# Patient Record
Sex: Male | Born: 1935
Health system: Southern US, Community
[De-identification: ages and names within clinical notes are randomized; demographics above are authoritative.]

## PROBLEM LIST (undated history)

## (undated) DIAGNOSIS — E785 Hyperlipidemia, unspecified: Secondary | ICD-10-CM

## (undated) DIAGNOSIS — M1990 Unspecified osteoarthritis, unspecified site: Secondary | ICD-10-CM

## (undated) DIAGNOSIS — I447 Left bundle-branch block, unspecified: Secondary | ICD-10-CM

## (undated) DIAGNOSIS — K635 Polyp of colon: Secondary | ICD-10-CM

## (undated) DIAGNOSIS — N200 Calculus of kidney: Secondary | ICD-10-CM

## (undated) DIAGNOSIS — I10 Essential (primary) hypertension: Secondary | ICD-10-CM

## (undated) DIAGNOSIS — S129XXA Fracture of neck, unspecified, initial encounter: Secondary | ICD-10-CM

## (undated) DIAGNOSIS — K2270 Barrett's esophagus without dysplasia: Secondary | ICD-10-CM

## (undated) DIAGNOSIS — D649 Anemia, unspecified: Secondary | ICD-10-CM

## (undated) HISTORY — DX: Left bundle-branch block, unspecified: I44.7

## (undated) HISTORY — DX: Unspecified osteoarthritis, unspecified site: M19.90

## (undated) HISTORY — PX: TONSILLECTOMY: SUR1361

## (undated) HISTORY — DX: Fracture of neck, unspecified, initial encounter: S12.9XXA

## (undated) HISTORY — DX: Barrett's esophagus without dysplasia: K22.70

## (undated) HISTORY — DX: Calculus of kidney: N20.0

## (undated) HISTORY — DX: Hyperlipidemia, unspecified: E78.5

## (undated) HISTORY — DX: Essential (primary) hypertension: I10

## (undated) HISTORY — DX: Anemia, unspecified: D64.9

## (undated) HISTORY — DX: Polyp of colon: K63.5

## (undated) HISTORY — PX: TOTAL HIP ARTHROPLASTY: SHX124

---

## 2004-03-28 ENCOUNTER — Ambulatory Visit: Payer: Self-pay | Admitting: Internal Medicine

## 2004-06-27 ENCOUNTER — Ambulatory Visit: Payer: Self-pay | Admitting: Internal Medicine

## 2004-07-11 ENCOUNTER — Ambulatory Visit: Payer: Self-pay | Admitting: Internal Medicine

## 2004-08-24 ENCOUNTER — Ambulatory Visit: Payer: Self-pay | Admitting: Internal Medicine

## 2005-01-25 ENCOUNTER — Ambulatory Visit: Payer: Self-pay | Admitting: Internal Medicine

## 2005-02-01 ENCOUNTER — Ambulatory Visit: Payer: Self-pay | Admitting: Internal Medicine

## 2005-02-09 ENCOUNTER — Encounter: Admission: RE | Admit: 2005-02-09 | Discharge: 2005-05-10 | Payer: Self-pay | Admitting: Internal Medicine

## 2005-05-18 ENCOUNTER — Ambulatory Visit: Payer: Self-pay | Admitting: Internal Medicine

## 2005-06-21 ENCOUNTER — Ambulatory Visit: Payer: Self-pay | Admitting: Internal Medicine

## 2005-12-21 ENCOUNTER — Ambulatory Visit: Payer: Self-pay | Admitting: Internal Medicine

## 2005-12-21 LAB — CONVERTED CEMR LAB
BUN: 14 mg/dL (ref 6–23)
CO2: 35 meq/L — ABNORMAL HIGH (ref 19–32)
Calcium: 9.9 mg/dL (ref 8.4–10.5)
Chol/HDL Ratio, serum: 3.2
Glomerular Filtration Rate, Af Am: 77 mL/min/{1.73_m2}
Glucose, Bld: 88 mg/dL (ref 70–99)
HDL: 63.2 mg/dL (ref >39.0)
Potassium: 3.9 meq/L (ref 3.5–5.1)
Sodium: 139 meq/L (ref 135–145)
Triglyceride fasting, serum: 76 mg/dL (ref 0–149)
VLDL: 15 mg/dL (ref 0–40)

## 2006-10-22 ENCOUNTER — Encounter (INDEPENDENT_AMBULATORY_CARE_PROVIDER_SITE_OTHER): Payer: Self-pay | Admitting: *Deleted

## 2006-11-13 ENCOUNTER — Ambulatory Visit: Payer: Self-pay | Admitting: Internal Medicine

## 2006-11-13 DIAGNOSIS — I10 Essential (primary) hypertension: Secondary | ICD-10-CM

## 2006-11-13 DIAGNOSIS — I447 Left bundle-branch block, unspecified: Secondary | ICD-10-CM | POA: Insufficient documentation

## 2006-11-13 DIAGNOSIS — E785 Hyperlipidemia, unspecified: Secondary | ICD-10-CM

## 2006-11-13 HISTORY — DX: Hyperlipidemia, unspecified: E78.5

## 2006-11-13 HISTORY — DX: Essential (primary) hypertension: I10

## 2006-11-23 ENCOUNTER — Ambulatory Visit: Payer: Self-pay | Admitting: Internal Medicine

## 2006-11-27 LAB — CONVERTED CEMR LAB
AST: 23 units/L (ref 0–37)
BUN: 14 mg/dL (ref 6–23)
Creatinine, Ser: 1 mg/dL (ref 0.4–1.5)
GFR calc non Af Amer: 78 mL/min
Glucose, Bld: 94 mg/dL (ref 70–99)
HDL: 58.6 mg/dL (ref >39.0)
Hemoglobin: 14.7 g/dL (ref 13.0–17.0)
LDL Cholesterol: 117 mg/dL — ABNORMAL HIGH (ref 0–99)
MCHC: 36 g/dL (ref 30.0–36.0)
MCV: 96.2 fL (ref 78.0–100.0)
Monocytes Relative: 11.6 % — ABNORMAL HIGH (ref 3.0–11.0)
Neutrophils Relative %: 62 % (ref 43.0–77.0)
PSA: 1.41 ng/mL (ref 0.10–4.00)
Platelets: 268 10*3/uL (ref 150–400)
Potassium: 3.9 meq/L (ref 3.5–5.1)
RBC: 4.24 M/uL (ref 4.22–5.81)
RDW: 11.9 % (ref 11.5–14.6)
Sodium: 142 meq/L (ref 135–145)
Total CHOL/HDL Ratio: 3.3
Triglycerides: 76 mg/dL (ref 0–149)

## 2006-11-30 ENCOUNTER — Ambulatory Visit: Payer: Self-pay | Admitting: Internal Medicine

## 2006-12-07 ENCOUNTER — Encounter: Payer: Self-pay | Admitting: Internal Medicine

## 2006-12-28 ENCOUNTER — Telehealth (INDEPENDENT_AMBULATORY_CARE_PROVIDER_SITE_OTHER): Payer: Self-pay | Admitting: *Deleted

## 2007-08-19 ENCOUNTER — Ambulatory Visit: Payer: Self-pay | Admitting: Gastroenterology

## 2007-09-02 ENCOUNTER — Encounter: Payer: Self-pay | Admitting: Gastroenterology

## 2007-09-02 ENCOUNTER — Ambulatory Visit: Payer: Self-pay | Admitting: Gastroenterology

## 2007-09-03 ENCOUNTER — Encounter: Payer: Self-pay | Admitting: Gastroenterology

## 2008-01-29 ENCOUNTER — Ambulatory Visit: Payer: Self-pay | Admitting: Internal Medicine

## 2008-01-29 DIAGNOSIS — M1990 Unspecified osteoarthritis, unspecified site: Secondary | ICD-10-CM

## 2008-02-04 ENCOUNTER — Ambulatory Visit: Payer: Self-pay | Admitting: Internal Medicine

## 2008-02-10 ENCOUNTER — Telehealth (INDEPENDENT_AMBULATORY_CARE_PROVIDER_SITE_OTHER): Payer: Self-pay | Admitting: *Deleted

## 2008-02-10 LAB — CONVERTED CEMR LAB
Cholesterol: 214 mg/dL (ref 0–200)
HDL: 71 mg/dL (ref >39.0)
Total CHOL/HDL Ratio: 3
Triglycerides: 94 mg/dL (ref 0–149)
VLDL: 19 mg/dL (ref 0–40)

## 2008-04-01 ENCOUNTER — Ambulatory Visit: Payer: Self-pay | Admitting: Internal Medicine

## 2008-07-30 ENCOUNTER — Ambulatory Visit: Payer: Self-pay | Admitting: Internal Medicine

## 2008-07-30 DIAGNOSIS — N400 Enlarged prostate without lower urinary tract symptoms: Secondary | ICD-10-CM

## 2008-07-30 HISTORY — DX: Benign prostatic hyperplasia without lower urinary tract symptoms: N40.0

## 2008-08-03 LAB — CONVERTED CEMR LAB: PSA: 2.29 ng/mL (ref 0.10–4.00)

## 2008-10-20 ENCOUNTER — Telehealth (INDEPENDENT_AMBULATORY_CARE_PROVIDER_SITE_OTHER): Payer: Self-pay | Admitting: *Deleted

## 2008-10-28 ENCOUNTER — Ambulatory Visit: Payer: Self-pay | Admitting: Internal Medicine

## 2008-10-31 ENCOUNTER — Encounter: Admission: RE | Admit: 2008-10-31 | Discharge: 2008-10-31 | Payer: Self-pay | Admitting: Internal Medicine

## 2008-11-06 ENCOUNTER — Encounter: Payer: Self-pay | Admitting: Internal Medicine

## 2009-02-08 ENCOUNTER — Ambulatory Visit: Payer: Self-pay | Admitting: Internal Medicine

## 2009-02-11 LAB — CONVERTED CEMR LAB
BUN: 13 mg/dL (ref 6–23)
Basophils Absolute: 0.1 10*3/uL (ref 0.0–0.1)
Basophils Relative: 0.7 % (ref 0.0–3.0)
Calcium: 10.2 mg/dL (ref 8.4–10.5)
Chloride: 98 meq/L (ref 96–112)
Cholesterol: 237 mg/dL — ABNORMAL HIGH (ref 0–200)
Eosinophils Absolute: 0.2 10*3/uL (ref 0.0–0.7)
Glucose, Bld: 98 mg/dL (ref 70–99)
Lymphocytes Relative: 18.4 % (ref 12.0–46.0)
MCHC: 34.1 g/dL (ref 30.0–36.0)
Monocytes Absolute: 0.9 10*3/uL (ref 0.1–1.0)
Monocytes Relative: 11 % (ref 3.0–12.0)
Neutrophils Relative %: 67.4 % (ref 43.0–77.0)
RBC: 4.22 M/uL (ref 4.22–5.81)
Total CHOL/HDL Ratio: 3
Triglycerides: 93 mg/dL (ref 0.0–149.0)

## 2009-02-16 ENCOUNTER — Encounter (INDEPENDENT_AMBULATORY_CARE_PROVIDER_SITE_OTHER): Payer: Self-pay | Admitting: *Deleted

## 2009-03-01 ENCOUNTER — Telehealth (INDEPENDENT_AMBULATORY_CARE_PROVIDER_SITE_OTHER): Payer: Self-pay | Admitting: *Deleted

## 2009-03-17 ENCOUNTER — Encounter: Payer: Self-pay | Admitting: Internal Medicine

## 2009-03-26 ENCOUNTER — Encounter: Payer: Self-pay | Admitting: Internal Medicine

## 2009-03-27 ENCOUNTER — Ambulatory Visit (HOSPITAL_COMMUNITY): Admission: RE | Admit: 2009-03-27 | Discharge: 2009-03-27 | Payer: Self-pay | Admitting: Neurology

## 2009-04-02 ENCOUNTER — Encounter: Payer: Self-pay | Admitting: Internal Medicine

## 2009-04-05 ENCOUNTER — Telehealth (INDEPENDENT_AMBULATORY_CARE_PROVIDER_SITE_OTHER): Payer: Self-pay | Admitting: *Deleted

## 2009-04-07 ENCOUNTER — Telehealth (INDEPENDENT_AMBULATORY_CARE_PROVIDER_SITE_OTHER): Payer: Self-pay | Admitting: *Deleted

## 2009-04-30 ENCOUNTER — Ambulatory Visit: Payer: Self-pay | Admitting: Internal Medicine

## 2009-05-03 LAB — CONVERTED CEMR LAB: Folate: >20 ng/mL

## 2009-05-24 ENCOUNTER — Encounter: Payer: Self-pay | Admitting: Internal Medicine

## 2009-05-25 ENCOUNTER — Encounter: Admission: RE | Admit: 2009-05-25 | Discharge: 2009-08-23 | Payer: Self-pay | Admitting: Orthopedic Surgery

## 2009-05-26 ENCOUNTER — Ambulatory Visit: Payer: Self-pay | Admitting: Internal Medicine

## 2009-05-26 LAB — CONVERTED CEMR LAB
Bilirubin Urine: NEGATIVE
Glucose, Urine, Semiquant: NEGATIVE
Nitrite: NEGATIVE
Urobilinogen, UA: 0.2
WBC Urine, dipstick: NEGATIVE

## 2009-05-27 ENCOUNTER — Encounter: Payer: Self-pay | Admitting: Internal Medicine

## 2009-05-28 ENCOUNTER — Ambulatory Visit: Payer: Self-pay | Admitting: Diagnostic Radiology

## 2009-05-28 ENCOUNTER — Emergency Department (HOSPITAL_BASED_OUTPATIENT_CLINIC_OR_DEPARTMENT_OTHER): Admission: EM | Admit: 2009-05-28 | Discharge: 2009-05-28 | Payer: Self-pay | Admitting: Emergency Medicine

## 2009-05-31 ENCOUNTER — Inpatient Hospital Stay (HOSPITAL_COMMUNITY): Admission: EM | Admit: 2009-05-31 | Discharge: 2009-06-05 | Payer: Self-pay | Admitting: Emergency Medicine

## 2009-05-31 ENCOUNTER — Telehealth: Payer: Self-pay | Admitting: Internal Medicine

## 2009-06-03 LAB — CONVERTED CEMR LAB

## 2009-06-07 ENCOUNTER — Encounter: Payer: Self-pay | Admitting: Internal Medicine

## 2009-06-16 ENCOUNTER — Ambulatory Visit: Payer: Self-pay | Admitting: Internal Medicine

## 2009-06-16 DIAGNOSIS — G4700 Insomnia, unspecified: Secondary | ICD-10-CM | POA: Insufficient documentation

## 2009-06-21 ENCOUNTER — Telehealth (INDEPENDENT_AMBULATORY_CARE_PROVIDER_SITE_OTHER): Payer: Self-pay | Admitting: *Deleted

## 2009-06-22 ENCOUNTER — Ambulatory Visit: Payer: Self-pay | Admitting: Internal Medicine

## 2009-06-22 ENCOUNTER — Ambulatory Visit: Payer: Self-pay

## 2009-06-23 ENCOUNTER — Encounter (INDEPENDENT_AMBULATORY_CARE_PROVIDER_SITE_OTHER): Payer: Self-pay | Admitting: *Deleted

## 2009-06-23 ENCOUNTER — Encounter: Payer: Self-pay | Admitting: Internal Medicine

## 2009-06-23 ENCOUNTER — Ambulatory Visit: Payer: Self-pay | Admitting: Internal Medicine

## 2009-06-25 ENCOUNTER — Ambulatory Visit: Payer: Self-pay | Admitting: Internal Medicine

## 2009-07-09 ENCOUNTER — Ambulatory Visit: Payer: Self-pay | Admitting: Internal Medicine

## 2009-07-12 LAB — CONVERTED CEMR LAB
BUN: 20 mg/dL (ref 6–23)
CO2: 29 meq/L (ref 19–32)
Chloride: 99 meq/L (ref 96–112)
Glucose, Bld: 88 mg/dL (ref 70–99)
Potassium: 5.2 meq/L (ref 3.5–5.3)
Sodium: 139 meq/L (ref 135–145)

## 2009-08-23 ENCOUNTER — Encounter: Admission: RE | Admit: 2009-08-23 | Discharge: 2009-09-29 | Payer: Self-pay | Admitting: Orthopedic Surgery

## 2009-08-25 ENCOUNTER — Ambulatory Visit: Payer: Self-pay | Admitting: Internal Medicine

## 2009-08-25 LAB — CONVERTED CEMR LAB: Hemoglobin: 15.1 g/dL

## 2009-09-27 ENCOUNTER — Telehealth: Payer: Self-pay | Admitting: Internal Medicine

## 2009-09-28 ENCOUNTER — Encounter: Payer: Self-pay | Admitting: Internal Medicine

## 2009-09-28 ENCOUNTER — Telehealth: Payer: Self-pay | Admitting: Internal Medicine

## 2009-10-04 ENCOUNTER — Encounter: Payer: Self-pay | Admitting: Internal Medicine

## 2009-10-07 HISTORY — PX: TOTAL HIP ARTHROPLASTY: SHX124

## 2009-10-22 ENCOUNTER — Encounter: Payer: Self-pay | Admitting: Internal Medicine

## 2009-11-08 ENCOUNTER — Encounter
Admission: RE | Admit: 2009-11-08 | Discharge: 2010-01-25 | Payer: Self-pay | Source: Home / Self Care | Attending: Orthopedic Surgery | Admitting: Orthopedic Surgery

## 2009-12-03 ENCOUNTER — Encounter: Payer: Self-pay | Admitting: Internal Medicine

## 2010-02-06 DIAGNOSIS — N200 Calculus of kidney: Secondary | ICD-10-CM

## 2010-02-06 HISTORY — DX: Calculus of kidney: N20.0

## 2010-02-17 ENCOUNTER — Ambulatory Visit
Admission: RE | Admit: 2010-02-17 | Discharge: 2010-02-17 | Payer: Self-pay | Source: Home / Self Care | Attending: Internal Medicine | Admitting: Internal Medicine

## 2010-02-17 ENCOUNTER — Other Ambulatory Visit: Payer: Self-pay | Admitting: Internal Medicine

## 2010-02-17 LAB — BASIC METABOLIC PANEL
BUN: 22 mg/dL (ref 6–23)
CO2: 27 mEq/L (ref 19–32)
Calcium: 9.2 mg/dL (ref 8.4–10.5)
Chloride: 99 mEq/L (ref 96–112)
Creatinine, Ser: 1 mg/dL (ref 0.4–1.5)
GFR: 78.37 mL/min (ref >60.00)
Glucose, Bld: 90 mg/dL (ref 70–99)
Potassium: 3.4 mEq/L — ABNORMAL LOW (ref 3.5–5.1)
Sodium: 140 mEq/L (ref 135–145)

## 2010-02-17 LAB — CBC WITH DIFFERENTIAL/PLATELET
Basophils Absolute: 0 10*3/uL (ref 0.0–0.1)
Basophils Relative: 0.4 % (ref 0.0–3.0)
Eosinophils Absolute: 0.2 10*3/uL (ref 0.0–0.7)
Eosinophils Relative: 3.2 % (ref 0.0–5.0)
HCT: 44.3 % (ref 39.0–52.0)
Hemoglobin: 15.5 g/dL (ref 13.0–17.0)
Lymphocytes Relative: 17.9 % (ref 12.0–46.0)
Lymphs Abs: 1.3 10*3/uL (ref 0.7–4.0)
MCHC: 34.9 g/dL (ref 30.0–36.0)
MCV: 99.2 fl (ref 78.0–100.0)
Monocytes Absolute: 0.9 10*3/uL (ref 0.1–1.0)
Monocytes Relative: 11.8 % (ref 3.0–12.0)
Neutro Abs: 4.8 10*3/uL (ref 1.4–7.7)
Neutrophils Relative %: 66.7 % (ref 43.0–77.0)
Platelets: 287 10*3/uL (ref 150.0–400.0)
RBC: 4.47 Mil/uL (ref 4.22–5.81)
RDW: 13.8 % (ref 11.5–14.6)
WBC: 7.2 10*3/uL (ref 4.5–10.5)

## 2010-02-17 LAB — LIPID PANEL
Cholesterol: 228 mg/dL — ABNORMAL HIGH (ref 0–200)
HDL: 70.3 mg/dL (ref >39.00)
Total CHOL/HDL Ratio: 3
Triglycerides: 79 mg/dL (ref 0.0–149.0)
VLDL: 15.8 mg/dL (ref 0.0–40.0)

## 2010-02-17 LAB — LDL CHOLESTEROL, DIRECT: Direct LDL: 142.8 mg/dL

## 2010-02-17 LAB — PSA: PSA: 2.01 ng/mL (ref 0.10–4.00)

## 2010-03-06 LAB — CONVERTED CEMR LAB: Hemoglobin: 12.6 g/dL

## 2010-03-10 NOTE — Assessment & Plan Note (Signed)
Summary: check vitals/swh  Nurse Visit   Vital Signs:  Patient profile:   77 year old male Height:      72 inches Weight:      168 pounds Pulse rate:   110 / minute BP sitting:   140 / 80  Vitals Entered By: Shary Decamp (Jun 25, 2009 3:30 PM) Comments PULSE @ HOME [facility] .......Marland KitchenShary Decamp  Jun 25, 2009 3:43 PM Discussed with Dr. Drue Novel.  Atenolol Chlorthalidone 50/25 1 daily was d/c'd in hospital.  Dr. Drue Novel would like for pt to restart @ 1/2 tablet daily.  Nurse visit in 2 weeks for bp, bmp. ........Marland KitchenShary Decamp  Jun 25, 2009 3:59 PM    Serial Vital Signs/Assessments:  Time      Position  BP       Pulse  Resp  Temp     By 3:43 PM             130/72   104                   Stacia Hawks   Current Medications (verified): 1)  Ferrous Sulfate 325 (65 Fe) Mg Tabs (Ferrous Sulfate) .... One By Mouth Twice A Day 2)  Tylenol Arthritis Pain 650 Mg Cr-Tabs (Acetaminophen) .... 2 Every 5 Hours 3)  Senekot .... 2 Daily 4)  Miralax .... 1/2 C Daily 5)  Mvi 6)  Zinc 7)  Atenolol-Chlorthalidone 50-25 Mg Tabs (Atenolol-Chlorthalidone) .Marland Kitchen.. 1 Daily  Allergies (verified): No Known Drug Allergies  Impression & Recommendations:  Problem # 1:  TACHYCARDIA (ICD-785.0) maybe related to the fact that he is not on BB ----> restart BB Orders: No Charge Patient Arrived (NCPA0) (NCPA0)  Problem # 2:  HYPERTENSION (ICD-401.9) re start meds at 1/2 dose  His updated medication list for this problem includes:    Atenolol-chlorthalidone 50-25 Mg Tabs (Atenolol-chlorthalidone) .Marland Kitchen... 1 daily  BP today: 140/80 Prior BP: 132/80 (06/22/2009)  Labs Reviewed: K+: 4.0 (02/08/2009) Creat: : 0.9 (02/08/2009)   Chol: 237 (02/08/2009)   HDL: 72.10 (02/08/2009)   LDL: DEL (02/04/2008)   TG: 93.0 (02/08/2009)  Complete Medication List: 1)  Ferrous Sulfate 325 (65 Fe) Mg Tabs (Ferrous sulfate) .... One by mouth twice a day 2)  Tylenol Arthritis Pain 650 Mg Cr-tabs (Acetaminophen) .... 2 every 5 hours 3)   Senekot  .... 2 daily 4)  Miralax  .... 1/2 c daily 5)  Mvi  6)  Zinc  7)  Atenolol-chlorthalidone 50-25 Mg Tabs (Atenolol-chlorthalidone) .Marland Kitchen.. 1 daily   Orders Added: 1)  No Charge Patient Arrived (NCPA0) [NCPA0] Prescriptions: ATENOLOL-CHLORTHALIDONE 50-25 MG TABS (ATENOLOL-CHLORTHALIDONE) 1 daily  #30 x 6   Entered by:   Shary Decamp   Authorized by:   Nolon Rod. Jonnae Fonseca MD   Signed by:   Shary Decamp on 06/25/2009   Method used:   Electronically to        [facility] [phone][facility] (retail)       [address]       [place], [place]  [address]       Ph: [phone]       Fax: [phone]   RxID:   [id number]

## 2010-03-10 NOTE — Assessment & Plan Note (Signed)
Summary: roa per pt//lch   Vital Signs:  Patient profile:   77 year old male Height:      72 inches Weight:      182.3 pounds BMI:     24.81 Pulse rate:   58 / minute BP sitting:   134 / 88  Vitals Entered By: R.Peeler CC: roa   History of Present Illness: routine office visit  Allergies: No Known Drug Allergies  Past History:  Past Medical History: Hypertension HYPERLIPIDEMIA, BORDERLINE  LBBB (ICD-426.3) Osteoarthritis, severe hip-back pain, saw  neurology, nerve conduction study 2/11 negative    Past Surgical History: Reviewed history from 11/13/2006 and no changes required. Tonsillectomy  Social History: Reviewed history from 01/29/2008 and no changes required. 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  They are thinking about doing a left hip replacement later on CV:  Denies chest pain or discomfort and swelling of feet. Resp:  Denies cough and shortness of breath. GI:  Denies bloody stools.  Physical Exam  General:  alert and well-developed.   Lungs:  normal respiratory effort, no intercostal retractions, no accessory muscle use, and normal breath sounds.   Heart:  normal rate, regular rhythm, and no murmur.   Msk:  still walks with difficulty due to pain Extremities:  no lower extremity edema   Impression & Recommendations:  Problem # 1:  ? of B12 DEFICIENCY (ICD-266.2)  last CBC showed elevated MCV, labs  Orders: Venipuncture (21308)  Problem # 2:  HYPERTENSION (ICD-401.9) stable His updated medication list for this problem includes:    Atenolol-chlorthalidone 50-25 Mg  Tabs (Atenolol-chlorthalidone) .Marland Kitchen... 1 by mouth once daily - due office visit 04/2009  BP today: 134/88 Prior BP: 138/84 (02/08/2009)  Labs Reviewed: K+: 4.0 (02/08/2009) Creat: : 0.9 (02/08/2009)   Chol: 237 (02/08/2009)   HDL: 72.10 (02/08/2009)   LDL: DEL (02/04/2008)   TG: 93.0 (02/08/2009)  Problem # 3:  OSTEOARTHRITIS (ICD-715.90) since the last office visit, he was seen by neurology due to back pain, it was determined that he had a  hip  problem, he saw an orthopedic surgeon at [facility], he is scheduled to have a right hip replacement in April.  They are thinking about doing a left hip replacement later on  Complete Medication List: 1)  Klor-con M20 20 Meq Tbcr (Potassium chloride crys cr) .Marland Kitchen.. 1 by mouth qd 2)  Atenolol-chlorthalidone 50-25 Mg Tabs (Atenolol-chlorthalidone) .Marland Kitchen.. 1 by mouth once daily - due office visit 04/2009  Patient Instructions: 1)  Please schedule a follow-up appointment in 6 months .

## 2010-03-10 NOTE — Letter (Signed)
Summary: CT Letter  [facility]  [address] [place], [place] [address]   Phone: [phone]  Fax: [phone]    [facility]  06/23/2009  Patient's Name: Chase Hernandez MRN: [id number]  The [place] Government (Patient Protection and [employer] Act, Sections [phone] and [phone], dated March 23. 2010) mandates that we provide information to Medicare/Medicaid patients on facilities other than those owned by [employer].   Section [phone] of the Act amends the in-office ancillary services exception under [place] laws by requiring a referring physician to inform patients in writing, at the time of a referral, that the patients may obtain specified imaging services such as a CT exam from an entity other than the designated imaging equipment requested from the referring physician.  The provision specifically requires the referring physician to provide the patient with a written list of suppliers who furnish such services in the area in which the patient resides.  [facility] imaging is pleased to provide you with the Medicare required information as it relates to CT services so that you an make an informed decision. [facility] takes pride in delivering high quality services where same day and next day exams are available. When your exam is performed at the [facility] CT scanner, the results are accessible to physicians and are retained in your electronic medical chart, where your physician can review the results at any time and compare them to any previous or future images you may have.  We hope you will continue to allow [place] the privilege of using our  services. Your scheduler has provided you a list of Imaging Centers within a radius of this office and your signature below indicates you have been given this information to make a wise choice.         Patient Signature:            Date:   06-23-2009 The above signature verifies that the patient was  given this letter and a choice in their imaging location.

## 2010-03-10 NOTE — Letter (Signed)
Summary: *Referral Letter  [facility]  [address] [place], [place] [address]   Phone: [phone]  Fax: [phone]    02/08/2009  Thank you in advance for agreeing to see my patient:  Macauley Mossberg [address] [place], [place]  [address]  Phone: [phone]  Reason for Referral:  Ayaansh developed severe hip pain less than a year ago; since then, he has been seen by the  orthopedic surgeon, a  MRI show changes consistent with OA.   He continued to be debilitated and has generalized lower extremity weakness. Please evaluate this nice gentleman  from the neurological standpoint and help me  rule-out other issues different from OA. Up to a year ago, Victorio  practiced  tae kwon do without any difficult   Current Medications: 1)  KLOR-CON M20 20 MEQ TBCR (POTASSIUM CHLORIDE CRYS CR) 1 by mouth qd 2)  ATENOLOL-CHLORTHALIDONE 50-25 MG TABS (ATENOLOL-CHLORTHALIDONE)    Past Medical History: 1)  Hypertension 2)  HYPERLIPIDEMIA, BORDERLINE  3)  LBBB (ICD-426.3) 4)  Osteoarthritis 5)  increased PSA      Pertinent Labs: will enclose   Thank you again for agreeing to see our patient; please contact us if you have any further questions or need additional information.  Sincerely,  Lemoyne Nestor E. Lovenia Debruler MD

## 2010-03-10 NOTE — Assessment & Plan Note (Signed)
Summary: CPX WITH FASTING LABS-SCM/N/S  Flu Vaccine Consent Questions     Do you have a history of severe allergic reactions to this vaccine? no    Any prior history of allergic reactions to egg and/or gelatin? no    Do you have a sensitivity to the preservative Thimersol? no    Do you have a past history of Guillan-Barre Syndrome? no    Do you currently have an acute febrile illness? no    Have you ever had a severe reaction to latex? no    Vaccine information given and explained to patient? yes    Are you currently pregnant? no    Lot Number:[id number]   Exp Date:08/05/2009   Site Given  right Deltoid IM Shary Decamp  February 08, 2009 8:09 AM   Vital Signs:  Patient profile:   77 year old male Height:      72.75 inches Weight:      178.6 pounds BMI:     23.81 Pulse rate:   70 / minute Pulse rhythm:   regular BP sitting:   138 / 84  (left arm) Cuff size:   large  Vitals Entered By: Shary Decamp (February 08, 2009 8:03 AM)  History of Present Illness: Hypertension-- good medication compliance, good ambulatory BPs   HYPERLIPIDEMIA,on diet, eats healthy   Osteoarthritis-- cont w/ hip pain , declines to take pain medication. Status-post physical therapy for two months. Still very weak and uses a walker  yearly--chart reviewed   Current Medications (verified): 1)  Klor-Con M20 20 Meq Tbcr (Potassium Chloride Crys Cr) .Marland Kitchen.. 1 By Mouth Qd 2)  Atenolol-Chlorthalidone 50-25 Mg Tabs (Atenolol-Chlorthalidone)  Allergies (verified): No Known Drug Allergies  Past History:  Past Medical History: Hypertension HYPERLIPIDEMIA, BORDERLINE  LBBB (ICD-426.3) Osteoarthritis increased PSA   Past Surgical History: Reviewed history from 11/13/2006 and no changes required. Tonsillectomy  Family History: Reviewed history from 01/29/2008 and no changes required. M: breast Ca GM: DM MI--no prostate ca--F? colon ca--no  Social History: Reviewed history from 01/29/2008 and no  changes required. 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 Resp:  Denies cough and shortness of breath. GI:  Denies bloody stools, diarrhea, nausea, and vomiting. GU:  Denies hematuria, urinary frequency, and urinary hesitancy. Neuro:  no b/b incontinence  no LE paresthesias (Some times at R foot?). Psych:  Denies anxiety and depression.  Physical Exam  General:  alert, well-developed, and well-nourished.   Lungs:  normal respiratory effort, no intercostal retractions, no accessory muscle use, and normal breath sounds.   Heart:  normal rate, regular rhythm, and no murmur.   Abdomen:  soft, non-tender, no distention, no masses, no guarding, and no rigidity.   Rectal:  No external abnormalities noted. Normal sphincter tone. No rectal masses or tenderness. Prostate:  Prostate gland firm and smooth, no enlargement, nodularity, tenderness, mass, asymmetry or induration. Msk:  generalized lower extremity weakness, slightly worse on the right side.  Walks very slow and with great difficulty using two canes Extremities:  no edema   Impression & Recommendations:  Problem # 1:  OSTEOARTHRITIS (ICD-715.90) the patient continued with severe motor  limitations status post orthopedic eval , the MRIs showed OA  only I remain concerned about the sudden onset of pain , LE weakness and motor deterioration  Up to a year ago, this gentleman practiced tae kwon do. His generalized lower extremity weakness concerns me. plan----Neurology referral The following medications were removed from the medication list:    Darvocet-n 100 100-650 Mg Tabs (Propoxyphene n-apap) .Marland Kitchen... 1 by mouth at bedtime as needed pain  Orders: T-Vitamin D (25-Hydroxy) [phone]) TLB-TSH (Thyroid Stimulating Hormone)  (84443-TSH) TLB-B12 + Folate Pnl (09811_91478-G95/AOZ) TLB-Sedimentation Rate (ESR) (85652-ESR) Neurology Referral (Neuro)  Problem # 2:  HEALTH SCREENING (ICD-V70.0) Td --2004 pneumonia shot--2007 got a  flu shot   apparently had a  shingles shot  DEXA 11-2006 normal Cscope 08-2007 had a tubular adenoma, next Cscope per chart is in 2014    Problem # 3:  HYPERLIPIDEMIA, BORDERLINE (ICD-272.4) diet only  Labs Reviewed: SGOT: 23 (11/23/2006)   SGPT: 27 (11/23/2006)   HDL:71.0 (02/04/2008), 58.6 (11/23/2006)  LDL:DEL (02/04/2008), 117 (11/23/2006)  Chol:214 (02/04/2008), 191 (11/23/2006)  Trig:94 (02/04/2008), 76 (11/23/2006)  Orders: TLB-Lipid Panel (80061-LIPID)  Problem # 4:  HYPERTENSION (ICD-401.9) at goal  His updated medication list for this problem includes:    Atenolol-chlorthalidone 50-25 Mg Tabs (Atenolol-chlorthalidone)    BP today: 138/84 Prior BP: 102/80 (10/28/2008)  Labs Reviewed: K+: 3.9 (11/23/2006) Creat: : 1.0 (11/23/2006)   Chol: 214 (02/04/2008)   HDL: 71.0 (02/04/2008)   LDL: DEL (02/04/2008)   TG: 94 (02/04/2008)  Orders: Venipuncture (30865) TLB-BMP (Basic Metabolic Panel-BMET) (80048-METABOL) TLB-CBC Platelet - w/Differential (85025-CBCD)  Problem # 5:  HYPERPLASIA PROSTATE UNS W/O UR OBST & OTH LUTS (ICD-600.90)  due for psa  Orders: TLB-PSA (Prostate Specific Antigen) (84153-PSA)  Complete Medication List: 1)  Klor-con M20 20 Meq Tbcr (Potassium chloride crys cr) .Marland Kitchen.. 1 by mouth qd 2)  Atenolol-chlorthalidone 50-25 Mg Tabs (Atenolol-chlorthalidone)  Other Orders: Flu Vaccine 81yrs + (78469) Administration Flu vaccine - MCR (G2952)  Patient Instructions: 1)  Please schedule a follow-up appointment in 3 months .    Preventive Care Screening  Prior Values:    PSA:  2.29 (07/30/2008)    Colonoscopy:  Location:  [facility].   (09/02/2007)    Last Tetanus Booster:  Historical (04/09/2002)    Last Flu Shot:  Fluvax MCR  (11/23/2006)    Last Pneumovax:  Historical (12/07/2005)

## 2010-03-10 NOTE — Letter (Signed)
Summary: BP & Weight Log from Patient  BP & Weight Log from Patient   Imported By: Lanelle Bal 10/05/2009 08:16:23  _____________________________________________________________________  External Attachment:    Type:   Image     Comment:   External Document

## 2010-03-10 NOTE — Assessment & Plan Note (Signed)
Summary: CPX/FASTING, wants flu shot/KN   Vital Signs:  Patient profile:   77 year old male Weight:      192.13 pounds Pulse rate:   74 / minute Pulse rhythm:   regular BP sitting:   128 / 82  (left arm) Cuff size:   large  Vitals Entered By: Army Fossa CMA (February 17, 2010 9:30 AM) CC: CPX, fasting  Comments no complaints  [facility] [address]   History of Present Illness: Here for Medicare AWV:  1.   Risk factors based on Past M, S, F history: reviewed  2.   Physical Activities: gradually going back to his usual activities after 2 hip replacementa 2011  3.   Depression/mood: mood very well lately , had a rough time last year but is improving  4.   Hearing: no problems reported or noted  5.   ADL's: independent , not using a walker or cane.  Driving again 6.   Fall Risk: no recent falls, he is still high risk, issue discussed although he had a lot of                                counseling per PT/OT 7.   Home Safety: does feel safe at home  8.   Height, weight, &visual acuity:  see VS, due for a eye check 03-2010 9.   Counseling: yes   10.   Labs ordered based on risk factors: yes  11.           Referral Coordination, if needed  12.           Care Plan, see a/p  13.            Cognitive Assessment: cognition, motor skills and memory seem normal  in addition, we discussed the following Hypertension-- ambulatory BPs readings reviewed,  ~ 120/70, pulse 80 to 90 HYPERLIPIDEMIA-- on diet only, eats very healthy Osteoarthritis, recovering slowly from 2 hip replacements 2011   Preventive Screening-Counseling & Management  Caffeine-Diet-Exercise     Does Patient Exercise: yes     Type of exercise: walking, free weights      Times/week: 7  Current Medications (verified): 1)  Tylenol Extra Strength 500 Mg Tabs (Acetaminophen) .... As Needed 2)  Senekot .... 2 Daily 3)  Mvi 4)  Zinc 5)  Atenolol-Chlorthalidone 50-25 Mg Tabs (Atenolol-Chlorthalidone) .... 1/2  Daily  Allergies (verified): No Known Drug Allergies  Past History:  Past Medical History: Hypertension HYPERLIPIDEMIA, BORDERLINE  LBBB (ICD-426.3) Osteoarthritis, severe hip-back pain, saw  neurology, nerve conduction study 2/11 negative; Pain was due to  hip arthritis      Past Surgical History: Tonsillectomy right HIP REPLACEMENT  surgery May 11, 2009 @ [facility] L hip replaced 10-2009   Family History: Reviewed history from 01/29/2008 and no changes required. breast Ca--M  DM-- GM MI--no prostate ca--F? 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 Resp:  Denies cough and shortness of breath. GI:  Denies bloody stools, nausea, and vomiting. GU:  Denies dysuria, hematuria, urinary frequency, and urinary hesitancy.  Physical Exam  General:  alert, well-developed, and well-nourished.   Neck:  supple, no masses, no thyromegaly,  and normal carotid upstroke.   Lungs:  normal respiratory effort, no intercostal retractions, no accessory muscle use, and normal breath sounds.   Heart:  normal rate, regular rhythm, no murmur, and no gallop.   Abdomen:  soft, non-tender, no distention, no masses, no guarding, and no rigidity.   Rectal:  No external abnormalities noted. Normal sphincter tone. No rectal masses or tenderness. Prostate:  Prostate gland firm and smooth, no enlargement, nodularity, tenderness, mass, asymmetry or induration. Extremities:  no pretibial edema bilaterally  Psych:  Oriented X3, normally interactive, good eye contact, not anxious appearing, and not depressed appearing.     Impression & Recommendations:  Problem # 1:  HEALTH SCREENING (ICD-V70.0)  Td --2004 pneumonia shot--2007 flu shot   --recommend it at his pharmacy , we ran out apparently had a  shingles shot before   DEXA 11-2006 normal  Cscope 08-2007 had a tubular adenoma, next Cscope per chart is in 2014  patient is gradually going back to his previous active lifestyle Good nutrition encouraged    Orders: Medicare -1st Annual Wellness Visit [phone])  Problem # 2:  ANEMIA-NOS (ICD-285.9) history of anemia last year. Labs The following medications were removed from the medication list:    Ferrous Sulfate 325 (65 Fe) Mg Tabs (Ferrous sulfate) ..... One by mouth twice a day  Orders: TLB-CBC Platelet - w/Differential (85025-CBCD) Specimen Handling (86578)  Problem # 3:  OSTEOARTHRITIS (ICD-715.90) recovering from bilateral hip replacement last year His updated medication list for this problem includes:    Tylenol Extra Strength 500 Mg Tabs (Acetaminophen) .Marland Kitchen... As needed  Problem # 4:  HYPERLIPIDEMIA, BORDERLINE (ICD-272.4) on diet only, labs Orders: Venipuncture (46962) TLB-Lipid Panel (80061-LIPID) Specimen Handling (95284)  Labs Reviewed: SGOT: 23 (11/23/2006)   SGPT: 27 (11/23/2006)   HDL:72.10 (02/08/2009), 71.0 (02/04/2008)  LDL:DEL (02/04/2008), 117 (11/23/2006)  Chol:237 (02/08/2009), 214 (02/04/2008)  Trig:93.0 (02/08/2009), 94 (02/04/2008)  Problem # 5:  HYPERTENSION (ICD-401.9) at goal  His updated medication list for this problem includes:    Atenolol-chlorthalidone 50-25 Mg Tabs (Atenolol-chlorthalidone) .Marland Kitchen... 1/2 daily  Orders: TLB-BMP (Basic Metabolic Panel-BMET) (80048-METABOL)  BP today: 128/82 Prior BP: 132/78 (08/25/2009)  Labs Reviewed: K+: 5.2 (07/09/2009) Creat: : 0.82 (07/09/2009)   Chol: 237 (02/08/2009)   HDL: 72.10 (02/08/2009)   LDL: DEL (02/04/2008)   TG: 93.0 (02/08/2009)  Problem # 6:  HYPERPLASIA PROSTATE UNS W/O UR OBST & OTH LUTS (ICD-600.90) essentially asymptomatic. Labs Orders: TLB-PSA (Prostate Specific Antigen) (84153-PSA) Specimen Handling  (99000)  Complete Medication List: 1)  Atenolol-chlorthalidone 50-25 Mg Tabs (Atenolol-chlorthalidone) .... 1/2 daily 2)  Tylenol Extra Strength 500 Mg Tabs (Acetaminophen) .... As needed 3)  Senekot  .... 2 daily 4)  Mvi  5)  Zinc   Patient Instructions: 1)  please get your flu shot at the pharmacy 2)  Please schedule a follow-up appointment in 6 months .    Orders Added: 1)  Venipuncture [36415] 2)  TLB-Lipid Panel [80061-LIPID] 3)  TLB-BMP (Basic Metabolic Panel-BMET) [80048-METABOL] 4)  TLB-CBC Platelet - w/Differential [85025-CBCD] 5)  TLB-PSA (Prostate Specific Antigen) [13244-WNU] 6)  Specimen Handling [99000] 7)  Est. Patient Level III [27253] 8)  Medicare -1st Annual Wellness Visit [G0438]     Risk Factors:  Exercise:  yes    Times per week:  7    Type:  walking, free weights

## 2010-03-10 NOTE — Progress Notes (Signed)
  Phone Note Call from Patient Call back at Home Phone [phone]   Caller: Patient Summary of Call: pt called left msg, Dr Terrace Arabia has found an orthopedic doctor for him, Dr Simeon Craft @ [facility],  --Spoke with pt;  appt with ortho 3.11/11 with Dr Simeon Craft at [facility] -can cancel appt 04/13/09  and will keep appt 04/30/09 with  Dr Drue Novel Initial call taken by: Kandice Hams,  April 07, 2009 11:00 AM

## 2010-03-10 NOTE — Consult Note (Signed)
Summary: planning L hip replacement---[facility]  [facility]   Imported By: Lanelle Bal 10/27/2009 09:51:05  _____________________________________________________________________  External Attachment:    Type:   Image     Comment:   External Document

## 2010-03-10 NOTE — Letter (Signed)
Summary: CT Imaging Options Form  CT Imaging Options Form   Imported By: Lanelle Bal 06/29/2009 12:21:58  _____________________________________________________________________  External Attachment:    Type:   Image     Comment:   External Document

## 2010-03-10 NOTE — Letter (Signed)
Summary: Primary Care Consult Scheduled Letter  [facility] [place]  [address] [place], [place] [address]   Phone: [phone]  Fax: [phone]      02/16/2009 MRN: [id number]  JADRIEL SAXER [address] [place], [place]  [address]    Dear Mr. Duerson,    We have scheduled an appointment for you.  At the recommendation of Dr. Wanda Plump, we have scheduled you a consult with Dr. Levert Feinstein with [facility] on 03-17-2009 at 3:00pm.  Their address is [address], [address], [place] [address]. The office phone number is [phone].  If this appointment day and time is not convenient for you, please feel free to call the office of the doctor you are being referred to at the number listed above and reschedule the appointment.    It is important for you to keep your scheduled appointments. We are here to make sure you are given good patient care.   Thank you,    Renee, Patient Care Coordinator [place] [facility]

## 2010-03-10 NOTE — Miscellaneous (Signed)
Summary: lab order from ortho  Lab order from [facility] [place], [place] fax [phone] phone [phone] ua & cx dx 599.7 Genella Mech, MD patient will bring in clean catch urine Shary Decamp  May 24, 2009 2:27 PM  Clinical Lists Changes

## 2010-03-10 NOTE — Letter (Signed)
Summary: s/p L [facility] replac.---[facility]   Imported By: Lanelle Bal 11/03/2009 08:40:49  _____________________________________________________________________  External Attachment:    Type:   Image     Comment:   External Document

## 2010-03-10 NOTE — Assessment & Plan Note (Signed)
Summary: BP CHECK---WALKIN--OK PER FELICIA///[facility]  Nurse Visit   Vital Signs:  Patient profile:   77 year old male Height:      72 inches Weight:      169 pounds Temp:     98.1 degrees F oral Pulse rate:   77 / minute BP sitting:   140 / 80  (left arm)  Vitals Entered By: Jeremy Johann CMA (July 09, 2009 4:22 PM)  Impression & Recommendations:  Problem # 1:  HYPERTENSION (ICD-401.9)  BP ok His updated medication list for this problem includes:    Atenolol-chlorthalidone 50-25 Mg Tabs (Atenolol-chlorthalidone) .Marland Kitchen... 1/2 daily  BP today: 140/80 Prior BP: 140/80 (06/25/2009)  Labs Reviewed: K+: 4.0 (02/08/2009) Creat: : 0.9 (02/08/2009)   Chol: 237 (02/08/2009)   HDL: 72.10 (02/08/2009)   LDL: DEL (02/04/2008)   TG: 93.0 (02/08/2009)  Orders: Venipuncture (16109) No Charge Patient Arrived (NCPA0) (NCPA0)  Complete Medication List: 1)  Ferrous Sulfate 325 (65 Fe) Mg Tabs (Ferrous sulfate) .... One by mouth twice a day 2)  Tylenol Extra Strength 500 Mg Tabs (Acetaminophen) .... As needed 3)  Senekot  .... 2 daily 4)  Miralax  .... 1/2 c daily 5)  Mvi  6)  Zinc  7)  Atenolol-chlorthalidone 50-25 Mg Tabs (Atenolol-chlorthalidone) .... 1/2 daily  CC: bp check Comments REVIEWED MED LIST, PATIENT AGREED DOSE AND INSTRUCTION CORRECT    Allergies: No Known Drug Allergies  Orders Added: 1)  Venipuncture [60454] 2)  No Charge Patient Arrived (NCPA0) [NCPA0]

## 2010-03-10 NOTE — Letter (Signed)
Summary: Encounter Notice/[facility]  Encounter Notice/[facility]   Imported By: Lanelle Bal 06/15/2009 09:30:43  _____________________________________________________________________  External Attachment:    Type:   Image     Comment:   External Document

## 2010-03-10 NOTE — Letter (Signed)
Summary: recovering well from surgery----Orthopaedics  [facility]   Imported By: Lanelle Bal 12/16/2009 12:50:09  _____________________________________________________________________  External Attachment:    Type:   Image     Comment:   External Document

## 2010-03-10 NOTE — Progress Notes (Signed)
  Phone Note Call from Patient   Caller: Patient Summary of Call: Pt saw Dr Terrace Arabia and mentiond referrig to Ortho, pt wanted to discuss wth Dr Drue Novel,  Ov scheduled .Kandice Hams  April 05, 2009 5:14 PM  Initial call taken by: Kandice Hams,  April 05, 2009 5:14 PM

## 2010-03-10 NOTE — Progress Notes (Signed)
Summary: Hospital Call-See additional f/u  Phone Note Call from Patient Call back at cell [phone]   Caller: Spouse Summary of Call: Pt was seen  Fri 05/28/09 at [facility]  for constipation, fecal inpaction, stomach pain and liquid oozing from bottom -They tried to disimpact by  hand  (wife says was very uncomfortable for pt) sent home with suppository and directions. (ED notes in E-chart) --Pt still very uncomfortable moving small "rabbit size pellets" abdominal pain and oozing liquid from bottom --Recommend ED, wife agreed will go to [facility] ED .Kandice Hams  May 31, 2009 11:03 AM  Initial call taken by: Kandice Hams,  May 31, 2009 11:03 AM  Follow-up for Phone Call        please check on him tomorrow [place] E. Geofrey Silliman MD  May 31, 2009 12:41 PM   --Spoke with wife, patient was admitted yesterday at [facility]--.Kandice Hams  June 01, 2009 9:12 AM    Additional Follow-up for Phone Call Additional follow up Details #1::        --Pt  was admitted yesterday [facility]    Additional Follow-up for Phone Call Additional follow up Details #2::    Hospital called to cancel Mr. Grundman appt. for tomorrow and to let you know he is doing fine and will possibly be discharged tomorrow.  The family will call back to reschedule the appt. Follow-up by: Barnie Mort,  June 01, 2009 12:40 PM  Additional Follow-up for Phone Call Additional follow up Details #3:: Details for Additional Follow-up Action Taken: thank you Maleni Seyer E. Latacha Texeira MD  June 01, 2009 12:56 PM

## 2010-03-10 NOTE — Assessment & Plan Note (Signed)
Summary: hosp/followup/alr   Vital Signs:  Patient profile:   77 year old male Height:      72 inches Weight:      162.2 pounds BMI:     22.08 Pulse rate:   92 / minute BP sitting:   124 / 80  Vitals Entered By: Shary Decamp (Jun 16, 2009 3:45 PM) CC: post hosp, pt has not been sleeping well since d/c from hosp   History of Present Illness: Hospital f/u , here with his wife,was admitted with constipation after he took codeine for pain after a hip replacement  chart is reviewed DATE OF ADMISSION:  05/31/2009   DATE OF DISCHARGE:                                  DISCHARGE SUMMARY      DISCHARGE DIAGNOSES:   1. Severe constipation with fecal impaction.   2. Urinary retention, resolved, felt to be secondary to number one.   3. Anemia.   4. Status post right hip replacement.   5. History of degenerative joint disease and osteoarthritis at [facility].     pertinent labs during admission hemoglobin 11.2, repeat hemoglobin 11.4 iron 29 which is low, ferritin 230 which is normal B12 and folic acid normal potassium 3.6, creatinine 0.8, LFTs normal  Current Medications (verified): 1)  None  Allergies (verified): No Known Drug Allergies  Past History:  Past Medical History: Hypertension HYPERLIPIDEMIA, BORDERLINE  LBBB (ICD-426.3) Osteoarthritis, severe hip-back pain, saw  neurology, nerve conduction study 2/11 negative      Past Surgical History: Tonsillectomy right HIP REPLACEMENT  surgery May 11, 2009 @ [facility]  Review of Systems       since he left the hospital he is doing better, still feels extremely weak denies nausea, vomiting, diarrhea denies epigastric pain and Motrin or Motrin-like medications usage no blood in the stools denies any problems with urinary retention he said his sleep pattern is upside down, sleepy  through the day and not able to sleep  at night concerned about the fact that since he left [facility]  hospital a month ago, he  is not  back on his BP medication and potassium  Physical Exam  General:  alert and well-developed.  not acutely ill or extremely weak appearing Lungs:  normal respiratory effort, no intercostal retractions, no accessory muscle use, and normal breath sounds.   Heart:  normal rate, regular rhythm, and no murmur.   Extremities:  no edema, cough symmetric and nontender Neurologic:  alert & oriented X3.   Psych:  Oriented X3, memory intact for recent and remote, normally interactive, good eye contact, not anxious appearing, and not depressed appearing.  seems in good spirits   Impression & Recommendations:  Problem # 1:  OSTEOARTHRITIS (ICD-715.90) status post right hip replacement, complicated by constipation and fetal impactation he is now  improving, pain is well controlled, takes only Tylenol as needed  Problem # 2:  ANEMIA-NOS (ICD-285.9)  found to have iron  deficiency anemia at the time of the last admission a few days ago chart is reviewed, previously his hemoglobin was normal, he had a colonoscopy in 2009 which showed two tubular  adenomas. Anemia likely post operative Plan:  Iron twice a day, watch for constipation he is also concerned about his weight and nutrition, see instructions  His updated medication list for this problem  includes:    Ferrous Sulfate 325 (65 Fe) Mg Tabs (Ferrous sulfate) ..... One by mouth twice a day  Problem # 3:  INSOMNIA-SLEEP DISORDER-UNSPEC (ICD-780.52) his sleep pattern is abnormal, likely from recent hospitalizations. Recommend to avoid daytime naps and see if his pattern normalizes,  if it does not,  he will let me know: Xanax?  Problem # 4:  HYPERTENSION (ICD-401.9) off medications since the hip  replacement in April 2011 BP today okay see instructions The following medications were removed from the medication list:    Atenolol-chlorthalidone 50-25 Mg Tabs (Atenolol-chlorthalidone) .Marland Kitchen... 1 by mouth once daily - due office visit  04/2009  Complete Medication List: 1)  Ferrous Sulfate 325 (65 Fe) Mg Tabs (Ferrous sulfate) .... One by mouth twice a day  Patient Instructions: 1)  Ensure one can twice a day 2)  start iron  twice a day, watch for constipation 3)  one multivitamin daily 4)  Check your blood pressure 2 or 3 times a week. If it is more than 140/85 consistently,please let us know  5)  Please schedule a follow-up appointment in 2 months.  Prescriptions: FERROUS SULFATE 325 (65 FE) MG TABS (FERROUS SULFATE) one by mouth twice a day  #60 x 3   Entered and Authorized by:   Chase Quick E. Kerrington Sova MD   Signed by:   Chase Rod. Carolena Fairbank MD on 06/16/2009   Method used:   [facility]ically to        [facility] [phone]* (retail)       [address]       [place], [place]  [address]       Ph: [phone]       Fax: [phone]   RxID:   [id number]

## 2010-03-10 NOTE — Assessment & Plan Note (Signed)
Summary: edema r foot/alr   Vital Signs:  Patient profile:   77 year old male Height:      72 inches Weight:      166.8 pounds BMI:     22.70 O2 Sat:      97 % on Room air Pulse rate:   125 / minute BP sitting:   132 / 80  Vitals Entered By: Shary Decamp (Jun 22, 2009 2:08 PM)  O2 Flow:  Room air CC: edema - ankles R>L   History of Present Illness: status post a right hip replacement April 5 , 2011 he was using a compression stocking on the right leg up to a week ago 3 days ago developed lower extremity edema, worse on the right, worse at the end of the day. No actual calf pain  Current Medications (verified): 1)  Ferrous Sulfate 325 (65 Fe) Mg Tabs (Ferrous Sulfate) .... One By Mouth Twice A Day 2)  Tylenol Arthritis Pain 650 Mg Cr-Tabs (Acetaminophen) .... 2 Every 5 Hours 3)  Senekot .... 2 Daily 4)  Miralax .... 1/2 C Daily 5)  Mvi 6)  Zinc  Allergies (verified): No Known Drug Allergies  Past History:  Past Medical History: Reviewed history from 06/16/2009 and no changes required. Hypertension HYPERLIPIDEMIA, BORDERLINE  LBBB (ICD-426.3) Osteoarthritis, severe hip-back pain, saw  neurology, nerve conduction study 2/11 negative      Past Surgical History: Tonsillectomy right HIP REPLACEMENT  surgery May 11, 2009 @ [facility]  Review of Systems       denies chest  pain or shortness of breath  Physical Exam  General:  alert, well-developed, and well-nourished.  alert, well-developed, and well-nourished.   Heart:  slightly tachycardic Pulses:  normal pedal pulses bilaterally Extremities:  right calf is half inch larger than the left Calf are nontender right foot: Trace edema on the right dorsal area   Impression & Recommendations:  Problem # 1:  LEG EDEMA, RIGHT (ICD-782.3) right leg edema, status post recent right hip replacement, high risk for DVTs Plan: Ultrasound today If the ultrasound is negative, I would recommend leg elevation and go  back to the compression stocking If the ultrasound is positive, I will recommend  ER evaluation. The patient is tachycardic and if he has a leg clot he will need further evaluation to rule out PE  Problem # 2:  TACHYCARDIA (ICD-785.0) We are checking a hemoglobin If the ultrasound is negative, I will ask him to come back in a day or 2 for recheck of his vital signs. addendum: u/s done yesterday was neg for DVT , I'm still concerned about the combination of LE edema, tachycardia a nd recent surgery: will gert a CT angio  chest , r/o PE  Patient has CT angio scheduled today @ 2:45.  Left 2 messages on machine for pt to return call.  unable to leave message on cell # [phone].....Marland KitchenMarland KitchenShary Decamp  Jun 23, 2009 9:24 AM discussed with pt  .....Marland KitchenMarland KitchenShary Decamp  Jun 23, 2009 9:48 AM CT negative for pulmonary emboli  Will come back in a couple days for BP and pulse check Chase Retter E. Lillion Elbert MD  Jun 23, 2009 5:03 PM   Orders: Fingerstick [phone]) Hgb [phone]) Radiology Referral (Radiology)  Problem # 3:  time spent with the patient and coordinating his care more than 25 minutes  Complete Medication List: 1)  Ferrous Sulfate 325 (65 Fe) Mg Tabs (Ferrous sulfate) .... One by mouth twice a day 2)  Tylenol Arthritis  Pain 650 Mg Cr-tabs (Acetaminophen) .... 2 every 5 hours 3)  Senekot  .... 2 daily 4)  Miralax  .... 1/2 c daily 5)  Mvi  6)  Zinc   Other Orders: Doppler Referral (Doppler)  Laboratory Results   CBC   HGB:  12.6 g/dL   (Normal Range: 04.5-40.9 in Males, 12.0-15.0 in Females)

## 2010-03-10 NOTE — Miscellaneous (Signed)
Summary: Orders Update  Clinical Lists Changes  Problems: Added new problem of LEG PAIN, RIGHT (ICD-729.5) Orders: Added new Test order of Venous Duplex Lower Extremity (Venous Duplex Lower) - Signed 

## 2010-03-10 NOTE — Consult Note (Signed)
Summary: [facility]  [facility]   Imported By: Lanelle Bal 03/31/2009 11:40:36  _____________________________________________________________________  External Attachment:    Type:   Image     Comment:   External Document

## 2010-03-10 NOTE — Letter (Signed)
Summary: Handicapped Placard/[facility]  Handicapped Placard/[facility]   Imported By: Lanelle Bal 05/05/2009 13:19:02  _____________________________________________________________________  External Attachment:    Type:   Image     Comment:   External Document

## 2010-03-10 NOTE — Progress Notes (Signed)
Summary: handicap sticker  Phone Note Call from Patient   Caller: Spouse Summary of Call: Pts spouse called and is requesting to be able to pick up a new Handicap sticker when she comes in to get her shingles shot tomorrow. Placed handicap form on your ledge to sign. Army Fossa CMA  September 27, 2009 12:44 PM   Follow-up for Phone Call        done  Follow-up by: [facility] E. Paz MD,  September 27, 2009 3:27 PM  Additional Follow-up for Phone Call Additional follow up Details #1::        will put upfront. Army Fossa CMA  September 27, 2009 3:29 PM

## 2010-03-10 NOTE — Progress Notes (Signed)
Summary: REFILL  Phone Note Refill Request Message from:  [facility] FAX [phone]  Refills Requested: Medication #1:  ATENOLOL-CHLORTHALIDONE 50-25 MG TABS. Initial call taken by: Barb Merino,  March 01, 2009 10:32 AM    New/Updated Medications: ATENOLOL-CHLORTHALIDONE 50-25 MG TABS (ATENOLOL-CHLORTHALIDONE) 1 by mouth once daily - DUE OFFICE VISIT 04/2009 Prescriptions: ATENOLOL-CHLORTHALIDONE 50-25 MG TABS (ATENOLOL-CHLORTHALIDONE) 1 by mouth once daily - DUE OFFICE VISIT 04/2009  #30 x 2   Entered by:   Shary Decamp   Authorized by:   Nolon Rod. Paz MD   Signed by:   Shary Decamp on 03/01/2009   Method used:   Electronically to        [facility] [phone][facility] (retail)       [address]       [place], [place]  [address]       Ph: [phone]       Fax: [phone]   RxID:   [id number]

## 2010-03-10 NOTE — Progress Notes (Signed)
Summary: BP and Weight Readings   Phone Note Call from Patient   Summary of Call: Pts wife dropped off BP Readings and Weight  08/28/09- 119/68 p- 89 09/11/09- 122/76 p- 87 09/19/09- 130-72 p- 93 09/28/09- 130/76 p-77  Weights 08/28/09- 177.0 09/04/09- 179.5 09/10/09- 179.5 09/14/09- 179.5 09/18/09- 180.5 09/25/09- 182.5 Initial call taken by: Army Fossa CMA,  September 28, 2009 4:04 PM  Follow-up for Phone Call        advise patient: very good readings! Follow-up by: Nolon Rod. Paz MD,  September 29, 2009 1:14 PM  Additional Follow-up for Phone Call Additional follow up Details #1::        Left detailed message for pt that his readings were very good! Army Fossa CMA  September 29, 2009 1:19 PM

## 2010-03-10 NOTE — Assessment & Plan Note (Signed)
Summary: rto 6 weeks/cbs   Vital Signs:  Patient profile:   77 year old male Weight:      182.25 pounds Pulse rate:   84 / minute Pulse rhythm:   regular BP sitting:   132 / 78  (left arm) Cuff size:   large  Vitals Entered By: Army Fossa CMA (August 25, 2009 2:54 PM) CC: Pt here to follow up on BP.    History of Present Illness: ROV feewl weel ambulatory BPs in the 120s range pulse varies from 88 to 99 eating very well, wt going back to normal (180pounds)  Current Medications (verified): 1)  Ferrous Sulfate 325 (65 Fe) Mg Tabs (Ferrous Sulfate) .... One By Mouth Twice A Day 2)  Tylenol Extra Strength 500 Mg Tabs (Acetaminophen) .... As Needed 3)  Senekot .... 2 Daily 4)  Miralax .... 1/2 C Daily 5)  Mvi 6)  Zinc 7)  Atenolol-Chlorthalidone 50-25 Mg Tabs (Atenolol-Chlorthalidone) .... 1/2 Daily  Allergies (verified): No Known Drug Allergies  Past History:  Past Medical History: Reviewed history from 06/16/2009 and no changes required. Hypertension HYPERLIPIDEMIA, BORDERLINE  LBBB (ICD-426.3) Osteoarthritis, severe hip-back pain, saw  neurology, nerve conduction study 2/11 negative      Past Surgical History: Reviewed history from 06/22/2009 and no changes required. Tonsillectomy right HIP REPLACEMENT  surgery May 11, 2009 @ [facility]  Review of Systems       pain at the R hip much improved now has pain in the L hip, ortho rec a THR on the L for September   Physical Exam  General:  alert and well-developed.   Lungs:  normal respiratory effort, no intercostal retractions, no accessory muscle use, and normal breath sounds.   Heart:  normal rate, regular rhythm, no murmur, and no gallop.   Psych:  Oriented X3, normally interactive, good eye contact, not anxious appearing, and not depressed appearing.     Impression & Recommendations:  Problem # 1:  HYPERTENSION (ICD-401.9) well controlled  His updated medication list for this problem includes:    Atenolol-chlorthalidone 50-25 Mg Tabs (Atenolol-chlorthalidone) .Marland Kitchen... 1/2 daily  BP today: 132/78 Prior BP: 140/80 (07/09/2009)  Labs Reviewed: K+: 5.2 (07/09/2009) Creat: : 0.82 (07/09/2009)   Chol: 237 (02/08/2009)   HDL: 72.10 (02/08/2009)   LDL: DEL (02/04/2008)   TG: 93.0 (02/08/2009)  Problem # 2:  OSTEOARTHRITIS (ICD-715.90) s/p R hip replacement 4-11 doing great planning L THR 9-11 on iron for post op anemia, Hg is now > 15 rec d/c iron in 1 months  His updated medication list for this problem includes:    Tylenol Extra Strength 500 Mg Tabs (Acetaminophen) .Marland Kitchen... As needed  Complete Medication List: 1)  Ferrous Sulfate 325 (65 Fe) Mg Tabs (Ferrous sulfate) .... One by mouth twice a day 2)  Tylenol Extra Strength 500 Mg Tabs (Acetaminophen) .... As needed 3)  Senekot  .... 2 daily 4)  Miralax  .... 1/2 c daily 5)  Mvi  6)  Zinc  7)  Atenolol-chlorthalidone 50-25 Mg Tabs (Atenolol-chlorthalidone) .... 1/2 daily  Other Orders: Hgb (16109)  Patient Instructions: 1)  Please schedule a follow-up appointment in 6 months, fasting physical 2)  stop iron in 1 month  Laboratory Results   CBC   HGB:  15.1 g/dL   (Normal Range: 60.4-54.0 in Males, 12.0-15.0 in Females) Comments: Army Fossa CMA  August 25, 2009 4:19 PM

## 2010-03-10 NOTE — Progress Notes (Signed)
Summary: edema right foot//line busy  Phone Note Call from Patient Call back at Home Phone [phone]   Caller: Spouse Summary of Call: wife called in ref to pt having swelling in right foot since friday. Called pt line busy// pt needs appt .Kandice Hams  Jun 21, 2009 4:11 PM Spoke with pt wife ov scheduled for tomorrow, recommend elevation of foot .Kandice Hams  Jun 21, 2009 4:41 PM   Initial call taken by: Kandice Hams,  Jun 21, 2009 4:41 PM

## 2010-03-10 NOTE — Consult Note (Signed)
Summary: [facility]  [facility]   Imported By: Lanelle Bal 04/02/2009 14:22:50  _____________________________________________________________________  External Attachment:    Type:   Image     Comment:   External Document

## 2010-04-07 DIAGNOSIS — S129XXA Fracture of neck, unspecified, initial encounter: Secondary | ICD-10-CM

## 2010-04-07 HISTORY — DX: Fracture of neck, unspecified, initial encounter: S12.9XXA

## 2010-04-14 ENCOUNTER — Emergency Department (HOSPITAL_COMMUNITY): Payer: Medicare Other

## 2010-04-14 ENCOUNTER — Emergency Department (HOSPITAL_COMMUNITY)
Admission: EM | Admit: 2010-04-14 | Discharge: 2010-04-14 | Disposition: A | Discharge status: Home / Self Care | Payer: Medicare Other | Attending: Otolaryngology | Admitting: Otolaryngology

## 2010-04-14 DIAGNOSIS — Y92410 Unspecified street and highway as the place of occurrence of the external cause: Secondary | ICD-10-CM | POA: Insufficient documentation

## 2010-04-14 DIAGNOSIS — S12600A Unspecified displaced fracture of seventh cervical vertebra, initial encounter for closed fracture: Secondary | ICD-10-CM | POA: Insufficient documentation

## 2010-04-14 DIAGNOSIS — M542 Cervicalgia: Secondary | ICD-10-CM | POA: Insufficient documentation

## 2010-04-14 DIAGNOSIS — R079 Chest pain, unspecified: Secondary | ICD-10-CM | POA: Insufficient documentation

## 2010-04-14 DIAGNOSIS — M549 Dorsalgia, unspecified: Secondary | ICD-10-CM | POA: Insufficient documentation

## 2010-04-14 DIAGNOSIS — I10 Essential (primary) hypertension: Secondary | ICD-10-CM | POA: Insufficient documentation

## 2010-04-14 DIAGNOSIS — M1990 Unspecified osteoarthritis, unspecified site: Secondary | ICD-10-CM | POA: Insufficient documentation

## 2010-04-14 LAB — CBC
HCT: 44 % (ref 39.0–52.0)
Hemoglobin: 16.3 g/dL (ref 13.0–17.0)
MCH: 34.8 pg — ABNORMAL HIGH (ref 26.0–34.0)
MCHC: 37 g/dL — ABNORMAL HIGH (ref 30.0–36.0)
MCV: 94 fL (ref 78.0–100.0)
Platelets: 273 10*3/uL (ref 150–400)
RBC: 4.68 MIL/uL (ref 4.22–5.81)
RDW: 12.6 % (ref 11.5–15.5)
WBC: 17.2 10*3/uL — ABNORMAL HIGH (ref 4.0–10.5)

## 2010-04-14 LAB — RAPID URINE DRUG SCREEN, HOSP PERFORMED
Amphetamines: NOT DETECTED
Barbiturates: NOT DETECTED
Benzodiazepines: NOT DETECTED
Tetrahydrocannabinol: NOT DETECTED

## 2010-04-14 LAB — ETHANOL: Alcohol, Ethyl (B): <5 mg/dL (ref 0–10)

## 2010-04-14 LAB — COMPREHENSIVE METABOLIC PANEL
Albumin: 4.5 g/dL (ref 3.5–5.2)
CO2: 26 mEq/L (ref 19–32)
Chloride: 96 mEq/L (ref 96–112)
GFR calc non Af Amer: >60 mL/min (ref >60)
Glucose, Bld: 119 mg/dL — ABNORMAL HIGH (ref 70–99)
Potassium: 4.3 mEq/L (ref 3.5–5.1)
Total Bilirubin: 0.5 mg/dL (ref 0.3–1.2)

## 2010-04-14 LAB — PROTIME-INR
INR: 0.88 (ref 0.00–1.49)
Prothrombin Time: 12.1 seconds (ref 11.6–15.2)
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
## 2010-04-26 ENCOUNTER — Other Ambulatory Visit: Payer: Self-pay | Admitting: Neurological Surgery

## 2010-04-26 ENCOUNTER — Ambulatory Visit
Admission: RE | Admit: 2010-04-26 | Discharge: 2010-04-26 | Disposition: A | Discharge status: Home / Self Care | Payer: Medicare Other | Source: Ambulatory Visit | Attending: Neurological Surgery | Admitting: Neurological Surgery

## 2010-04-26 DIAGNOSIS — S12600A Unspecified displaced fracture of seventh cervical vertebra, initial encounter for closed fracture: Secondary | ICD-10-CM

## 2010-04-26 LAB — RETICULOCYTES
Retic Count, Absolute: 47.8 10*3/uL (ref 19.0–186.0)
Retic Ct Pct: 1.3 % (ref 0.4–3.1)

## 2010-04-26 LAB — CBC
Hemoglobin: 11.4 g/dL — ABNORMAL LOW (ref 13.0–17.0)
MCHC: 32.6 g/dL (ref 30.0–36.0)
MCHC: 33.5 g/dL (ref 30.0–36.0)
MCHC: 33.7 g/dL (ref 30.0–36.0)
MCV: 99.2 fL (ref 78.0–100.0)
RBC: 3.48 MIL/uL — ABNORMAL LOW (ref 4.22–5.81)
RDW: 13.9 % (ref 11.5–15.5)
RDW: 14.4 % (ref 11.5–15.5)
WBC: 8.8 10*3/uL (ref 4.0–10.5)

## 2010-04-26 LAB — COMPREHENSIVE METABOLIC PANEL
ALT: 21 U/L (ref 0–53)
ALT: 22 U/L (ref 0–53)
AST: 23 U/L (ref 0–37)
CO2: 21 mEq/L (ref 19–32)
Calcium: 8.5 mg/dL (ref 8.4–10.5)
Calcium: 9 mg/dL (ref 8.4–10.5)
Chloride: 104 mEq/L (ref 96–112)
GFR calc Af Amer: >60 mL/min (ref >60)
GFR calc non Af Amer: >60 mL/min (ref >60)
Glucose, Bld: 89 mg/dL (ref 70–99)
Sodium: 137 mEq/L (ref 135–145)
Sodium: 141 mEq/L (ref 135–145)
Total Bilirubin: 1 mg/dL (ref 0.3–1.2)
Total Protein: 6.3 g/dL (ref 6.0–8.3)

## 2010-04-26 LAB — URINE CULTURE
Colony Count: NO GROWTH
Culture: NO GROWTH
Special Requests: NEGATIVE

## 2010-04-26 LAB — DIFFERENTIAL
Basophils Absolute: 0 10*3/uL (ref 0.0–0.1)
Basophils Relative: 0 % (ref 0–1)
Eosinophils Absolute: 0 10*3/uL (ref 0.0–0.7)
Eosinophils Absolute: 0 10*3/uL (ref 0.0–0.7)
Eosinophils Relative: 0 % (ref 0–5)
Lymphs Abs: 0.9 10*3/uL (ref 0.7–4.0)
Monocytes Relative: 12 % (ref 3–12)
Neutro Abs: 7.3 10*3/uL (ref 1.7–7.7)
Neutrophils Relative %: 71 % (ref 43–77)
Neutrophils Relative %: 83 % — ABNORMAL HIGH (ref 43–77)

## 2010-04-26 LAB — BASIC METABOLIC PANEL
CO2: 28 mEq/L (ref 19–32)
CO2: 35 mEq/L — ABNORMAL HIGH (ref 19–32)
Calcium: 8.5 mg/dL (ref 8.4–10.5)
Chloride: 102 mEq/L (ref 96–112)
Chloride: 106 mEq/L (ref 96–112)
Creatinine, Ser: 0.7 mg/dL (ref 0.4–1.5)
GFR calc Af Amer: >60 mL/min (ref >60)
GFR calc Af Amer: >60 mL/min (ref >60)
Glucose, Bld: 85 mg/dL (ref 70–99)
Sodium: 139 mEq/L (ref 135–145)
Sodium: 140 mEq/L (ref 135–145)

## 2010-04-26 LAB — URINALYSIS, ROUTINE W REFLEX MICROSCOPIC
Bilirubin Urine: NEGATIVE
Glucose, UA: NEGATIVE mg/dL
Ketones, ur: >80 mg/dL — AB
Leukocytes, UA: NEGATIVE
Protein, ur: NEGATIVE mg/dL

## 2010-04-26 LAB — IRON AND TIBC
Saturation Ratios: 11 % — ABNORMAL LOW (ref 20–55)
TIBC: 263 ug/dL (ref 215–435)

## 2010-04-26 LAB — FOLATE: Folate: >20 ng/mL

## 2010-04-26 LAB — POCT I-STAT, CHEM 8
Creatinine, Ser: 0.7 mg/dL (ref 0.4–1.5)
Glucose, Bld: 94 mg/dL (ref 70–99)
Hemoglobin: 11.6 g/dL — ABNORMAL LOW (ref 13.0–17.0)
TCO2: 22 mmol/L (ref 0–100)

## 2010-04-26 LAB — PROTIME-INR: Prothrombin Time: 14.4 seconds (ref 11.6–15.2)

## 2010-04-26 LAB — URINE MICROSCOPIC-ADD ON

## 2010-06-06 ENCOUNTER — Ambulatory Visit
Admission: RE | Admit: 2010-06-06 | Discharge: 2010-06-06 | Disposition: A | Discharge status: Home / Self Care | Payer: Medicare Other | Source: Ambulatory Visit | Attending: Neurological Surgery | Admitting: Neurological Surgery

## 2010-06-06 ENCOUNTER — Other Ambulatory Visit: Payer: Self-pay | Admitting: Neurological Surgery

## 2010-06-06 DIAGNOSIS — S12600A Unspecified displaced fracture of seventh cervical vertebra, initial encounter for closed fracture: Secondary | ICD-10-CM

## 2010-06-06 DIAGNOSIS — S129XXA Fracture of neck, unspecified, initial encounter: Secondary | ICD-10-CM

## 2010-06-23 IMAGING — CR DG ABDOMEN 2V
2 series · 2 of 2 positions shown · non-contrast
Comparison: 06/01/2009.

CLINICAL DATA: Postop complication.  Impaction.

ABDOMEN - 2 VIEW

[view not recorded (1 of 2)]
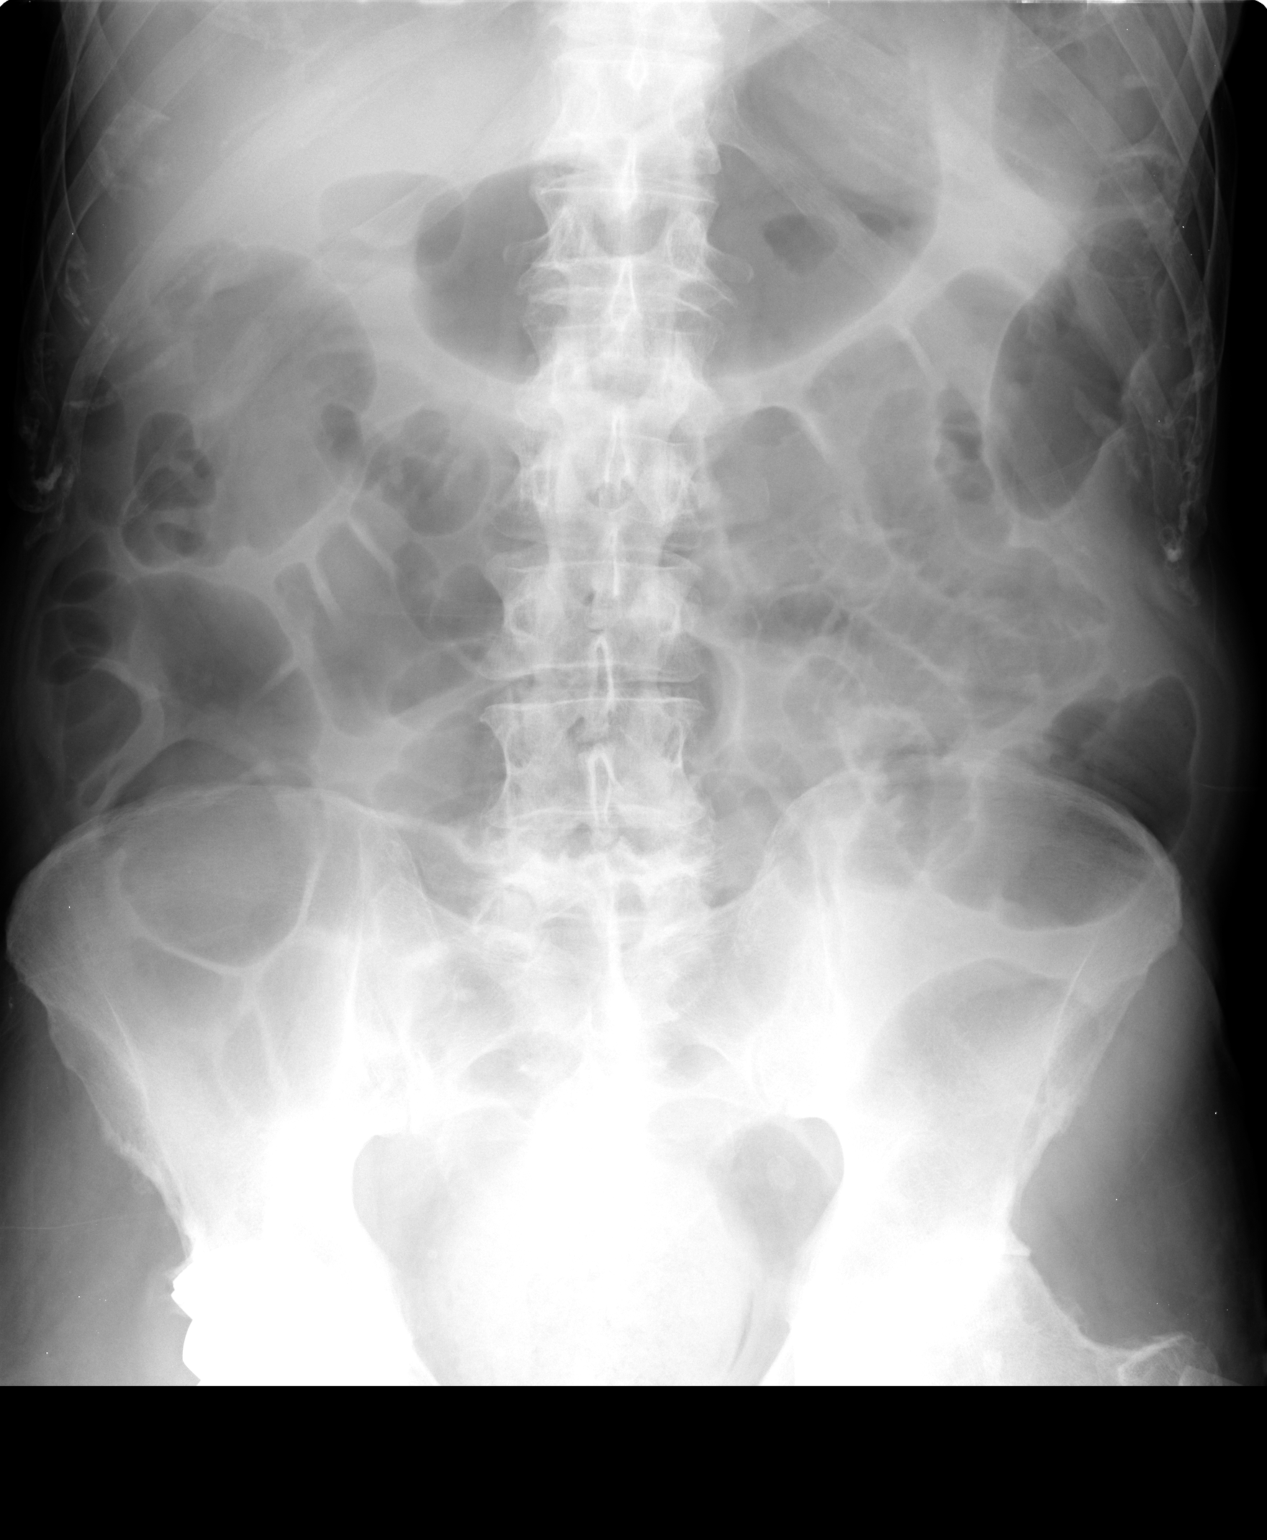

[view not recorded (2 of 2)]
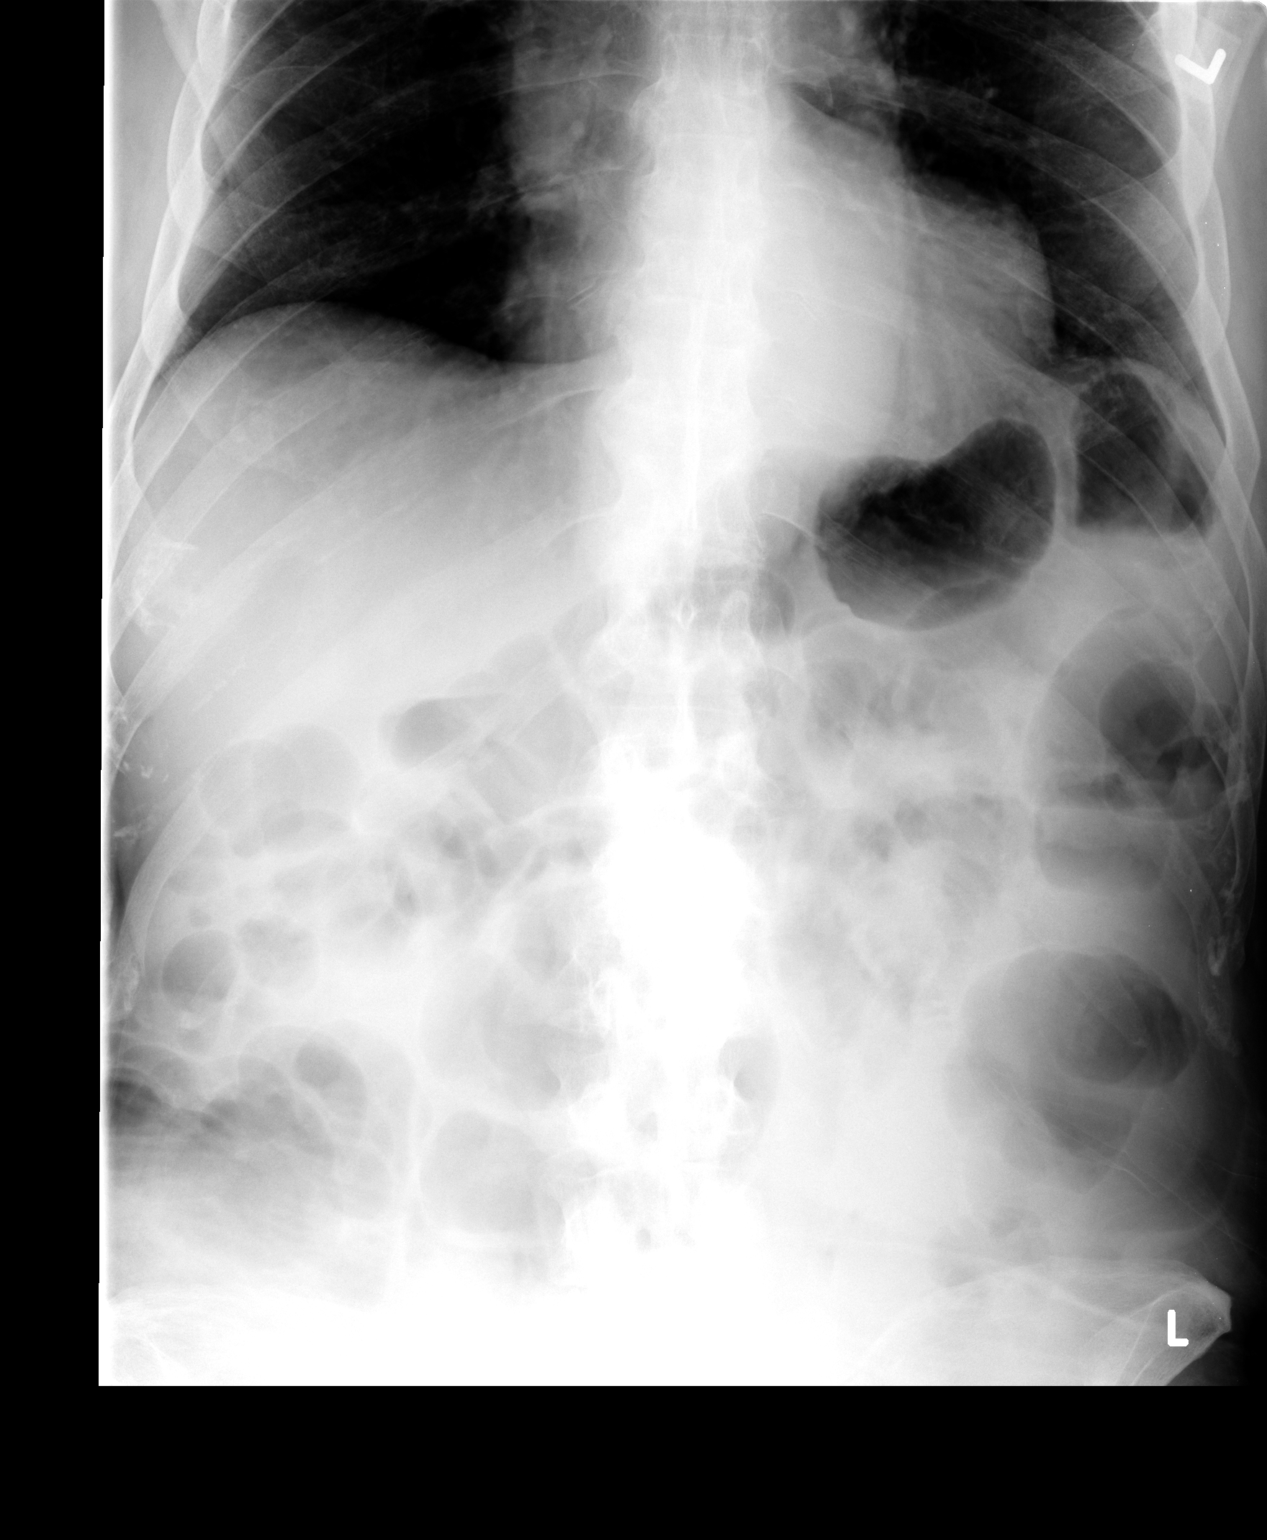

[2 of 2 positions shown; findings below may reference images not displayed]

FINDINGS: Nonspecific ileus pattern is again noted.  Scattered
small and large bowel gas.  No free air.  Rectal fecal impaction.

Right total hip prosthesis unremarkable.  Advanced OA of the left
hip.
IMPRESSION: Again appreciated is an ileus and rectal fecal impaction.

## 2010-06-25 IMAGING — CR DG ABDOMEN 2V
3 series · 3 of 3 positions shown · non-contrast
Comparison: 06/03/2009

CLINICAL DATA: Impaction, postop.

ABDOMEN - 2 VIEW

[view not recorded (1 of 3)]
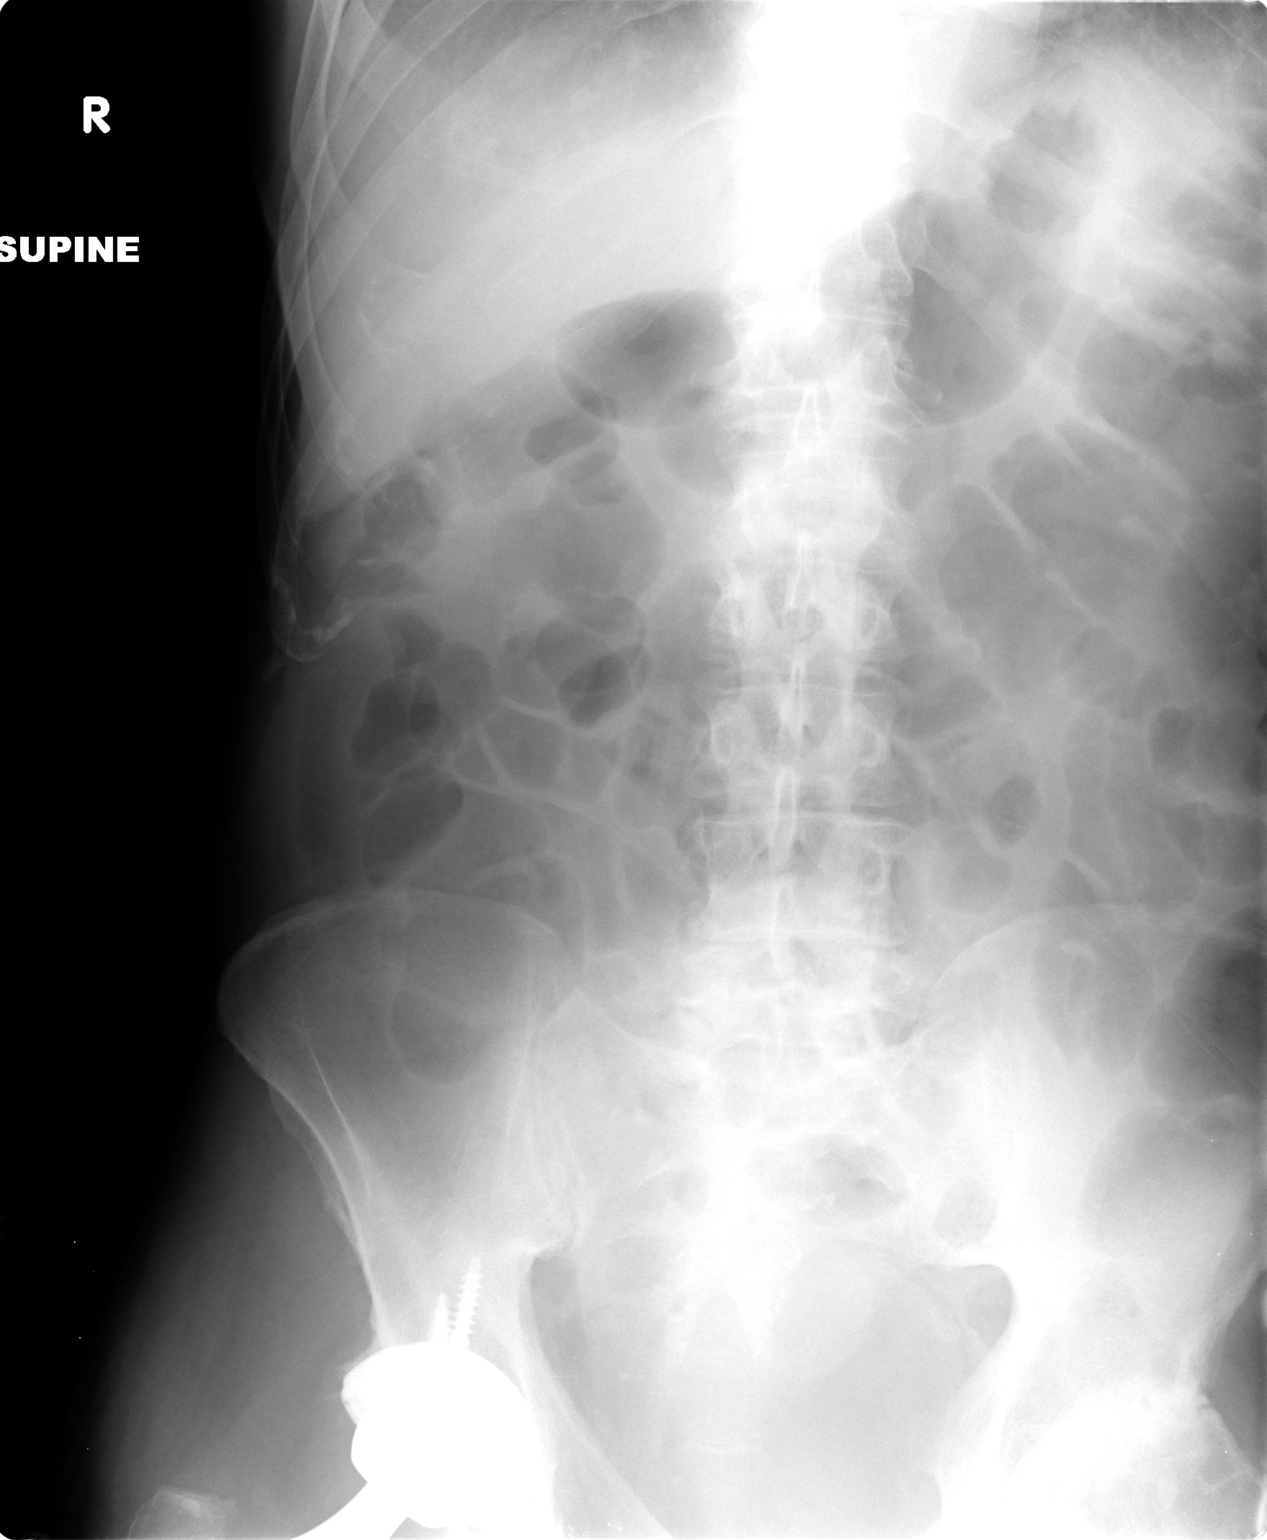

[view not recorded (2 of 3)]
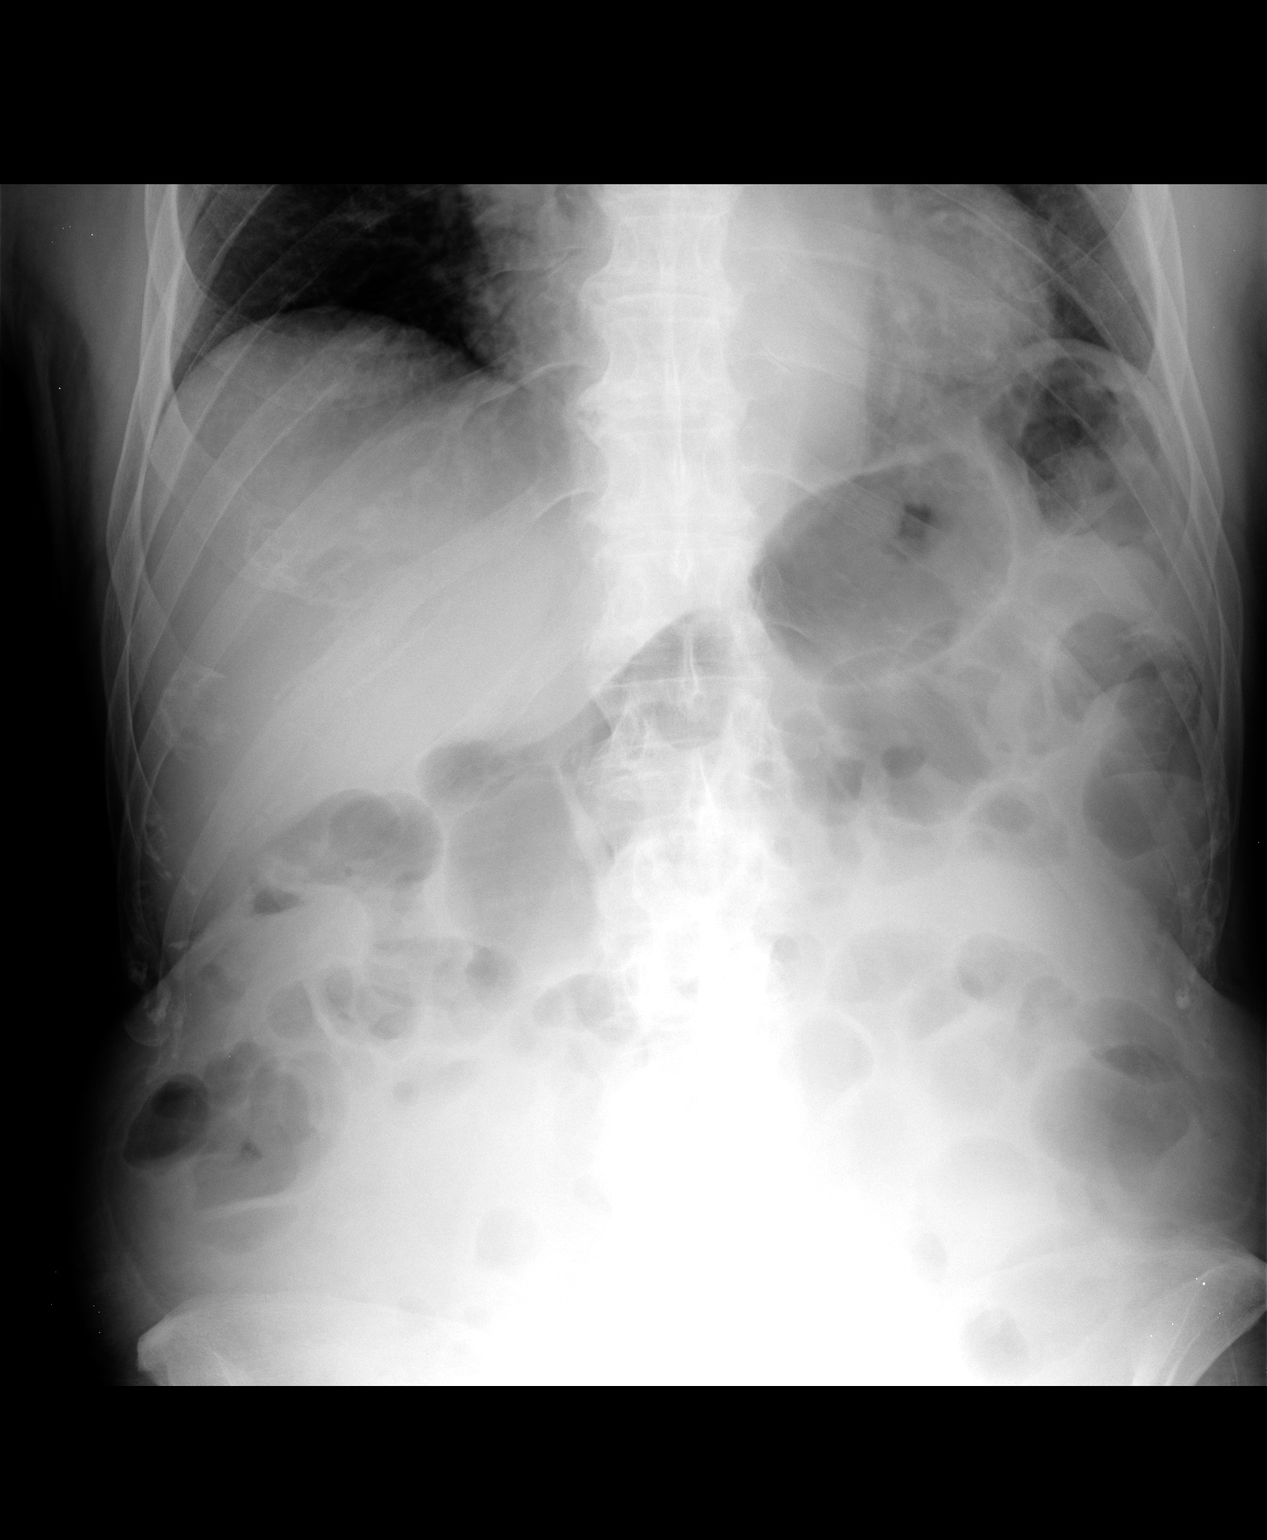

[view not recorded (3 of 3)]
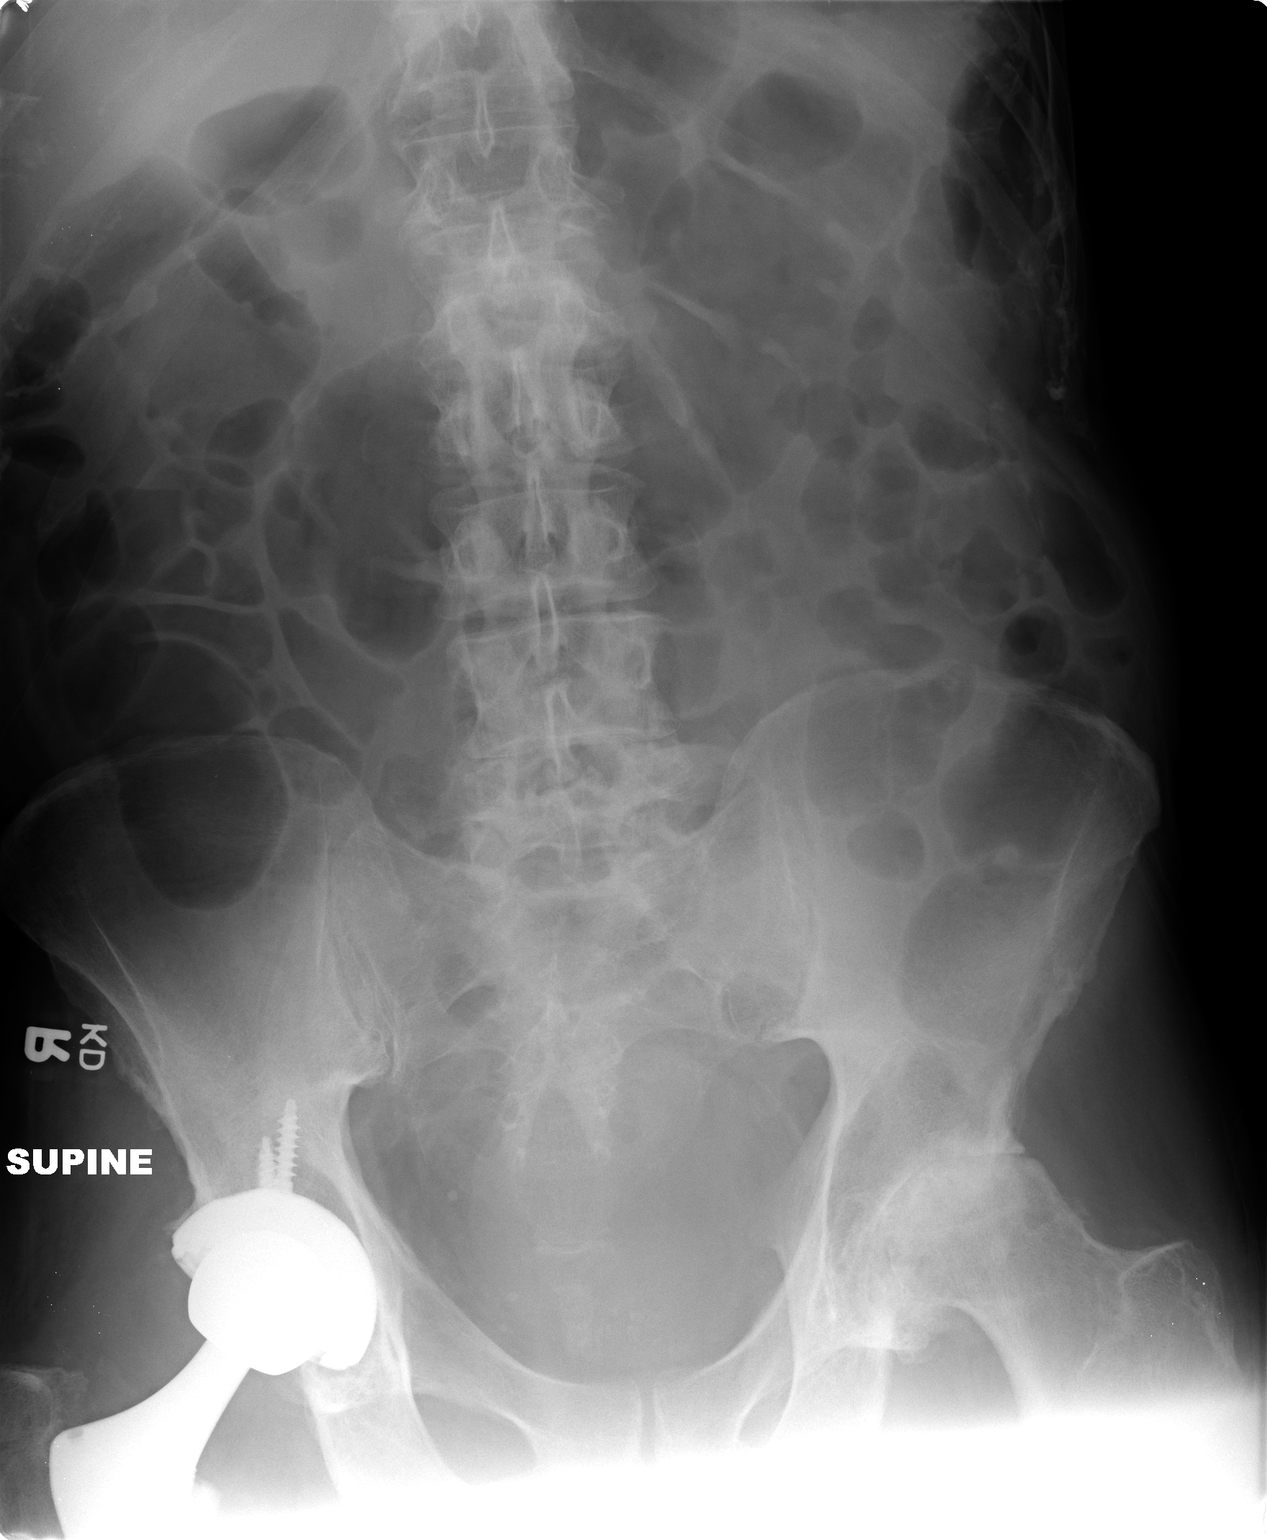

[3 of 3 positions shown; findings below may reference images not displayed]

FINDINGS: Mild gaseous distention of bowel, slightly improved since
prior study.  No organomegaly or free air.
IMPRESSION: Mild gas distention of bowel diffusely, slightly improved.

## 2010-07-11 ENCOUNTER — Ambulatory Visit
Admission: RE | Admit: 2010-07-11 | Discharge: 2010-07-11 | Disposition: A | Discharge status: Home / Self Care | Payer: Medicare Other | Source: Ambulatory Visit | Attending: Neurological Surgery | Admitting: Neurological Surgery

## 2010-07-11 DIAGNOSIS — S129XXA Fracture of neck, unspecified, initial encounter: Secondary | ICD-10-CM

## 2010-08-09 ENCOUNTER — Other Ambulatory Visit: Payer: Self-pay | Admitting: Internal Medicine

## 2010-08-09 ENCOUNTER — Telehealth: Payer: Self-pay | Admitting: *Deleted

## 2010-08-09 NOTE — Telephone Encounter (Signed)
Ok to RF a three-month supply, due for office visit, please be sure the patient knows

## 2010-08-09 NOTE — Telephone Encounter (Signed)
This med was d/c in hospital right?

## 2010-08-18 ENCOUNTER — Ambulatory Visit (INDEPENDENT_AMBULATORY_CARE_PROVIDER_SITE_OTHER): Payer: Medicare Other | Admitting: Internal Medicine

## 2010-08-18 ENCOUNTER — Encounter: Payer: Self-pay | Admitting: Internal Medicine

## 2010-08-18 DIAGNOSIS — I10 Essential (primary) hypertension: Secondary | ICD-10-CM

## 2010-08-18 DIAGNOSIS — M1990 Unspecified osteoarthritis, unspecified site: Secondary | ICD-10-CM

## 2010-08-18 NOTE — Assessment & Plan Note (Signed)
Gradually recovering from bilateral hip replacement. Since his last visit has gained 50% of his strength and he thinks there is room for improvement.

## 2010-08-18 NOTE — Assessment & Plan Note (Signed)
Doing well, last potassium slightly low, he is doing a potassium rich diet. Recheck on return to the office
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
## 2010-08-18 NOTE — Telephone Encounter (Signed)
Pt had appt on 7/12

## 2010-11-14 ENCOUNTER — Telehealth: Payer: Self-pay | Admitting: Internal Medicine

## 2010-11-14 NOTE — Telephone Encounter (Signed)
patient left a kidney stone at the lab, states he passed it. Chart reviewed, no history of previous urolithiasis . Please return stone to the patient for safe keeping. Needs to be seen if fever, abdominal pain, back pain, blood in the urine or any problems. Otherwise come back for his routine visit.

## 2010-11-29 NOTE — Telephone Encounter (Signed)
Did we ever called him ?

## 2010-11-30 NOTE — Telephone Encounter (Signed)
Left detailed message for pt.   Per Rene Kocher, pt never returned to get stone so she got rid of it

## 2011-02-04 ENCOUNTER — Other Ambulatory Visit: Payer: Self-pay | Admitting: Internal Medicine

## 2011-02-07 HISTORY — PX: CATARACT EXTRACTION W/ INTRAOCULAR LENS  IMPLANT, BILATERAL: SHX1307

## 2011-02-20 ENCOUNTER — Encounter: Payer: Self-pay | Admitting: Internal Medicine

## 2011-02-20 ENCOUNTER — Ambulatory Visit (INDEPENDENT_AMBULATORY_CARE_PROVIDER_SITE_OTHER): Payer: Medicare Other | Admitting: Internal Medicine

## 2011-02-20 VITALS — BP 120/70 | HR 72 | Temp 98.1°F | Ht 72.0 in | Wt 191.0 lb

## 2011-02-20 DIAGNOSIS — E785 Hyperlipidemia, unspecified: Secondary | ICD-10-CM | POA: Diagnosis not present

## 2011-02-20 DIAGNOSIS — Z0000 Encounter for general adult medical examination without abnormal findings: Secondary | ICD-10-CM | POA: Insufficient documentation

## 2011-02-20 DIAGNOSIS — N400 Enlarged prostate without lower urinary tract symptoms: Secondary | ICD-10-CM | POA: Diagnosis not present

## 2011-02-20 DIAGNOSIS — M1990 Unspecified osteoarthritis, unspecified site: Secondary | ICD-10-CM

## 2011-02-20 DIAGNOSIS — I10 Essential (primary) hypertension: Secondary | ICD-10-CM

## 2011-02-20 HISTORY — DX: Encounter for general adult medical examination without abnormal findings: Z00.00

## 2011-02-20 LAB — LIPID PANEL
Total CHOL/HDL Ratio: 3
VLDL: 16.2 mg/dL (ref 0.0–40.0)

## 2011-02-20 LAB — CBC WITH DIFFERENTIAL/PLATELET
Basophils Absolute: 0 10*3/uL (ref 0.0–0.1)
Basophils Relative: 0.6 % (ref 0.0–3.0)
Eosinophils Absolute: 0.2 10*3/uL (ref 0.0–0.7)
HCT: 42 % (ref 39.0–52.0)
Hemoglobin: 14.7 g/dL (ref 13.0–17.0)
Lymphs Abs: 1.2 10*3/uL (ref 0.7–4.0)
MCHC: 34.9 g/dL (ref 30.0–36.0)
Monocytes Relative: 11.7 % (ref 3.0–12.0)
Neutro Abs: 4.7 10*3/uL (ref 1.4–7.7)
RBC: 4.17 Mil/uL — ABNORMAL LOW (ref 4.22–5.81)
RDW: 12.9 % (ref 11.5–14.6)

## 2011-02-20 LAB — BASIC METABOLIC PANEL
CO2: 27 mEq/L (ref 19–32)
Glucose, Bld: 96 mg/dL (ref 70–99)
Potassium: 3.2 mEq/L — ABNORMAL LOW (ref 3.5–5.1)
Sodium: 139 mEq/L (ref 135–145)

## 2011-02-20 MED ORDER — ATENOLOL-CHLORTHALIDONE 50-25 MG PO TABS: 0.5000 | ORAL_TABLET | Freq: Every day | ORAL | Status: DC

## 2011-02-20 NOTE — Assessment & Plan Note (Signed)
Will check labs

## 2011-02-20 NOTE — Assessment & Plan Note (Signed)
Well controlled, labs  

## 2011-02-20 NOTE — Assessment & Plan Note (Addendum)
Td --2004 pneumonia shot--2007 Had a flu shot  already had a  shingles shot before   DEXA 11-2006 normal  Cscope 08-2007 had a tubular adenoma, next Cscope per chart is in 2014 Doing great w/ diet-exercise labs

## 2011-02-20 NOTE — Assessment & Plan Note (Signed)
No sx , DRE normal, PSAs stable over time Re asses next year

## 2011-02-20 NOTE — Progress Notes (Signed)
  Subjective:    Patient ID: Chase Hernandez, male    DOB: [birth year], 78 y.o.   MRN: [id number]  HPI Here for Medicare AWV: 1. Risk factors based on Past M, S, F history: reviewed  2. Physical Activities: back exercising almost back to where he was 3 years ago  3. Depression/mood: mood very good  4. Hearing: slt decrease hearing , no tinnitus, not affecting ADL 5. ADL's: independent , drives w/o problems  6. Fall Risk: no recent falls, decreased risk compared to last year 7. Home Safety: does feel safe at home  8. Height, weight, &visual acuity:  see VS, cataract L, watch by his eye doctor 9. Counseling: yes   10. Labs ordered based on risk factors: yes  11.       Referral Coordination, if needed  12.        are Plan, see a/p  13.       Cognitive Assessment: cognition, motor skills and memory seem normal  in addition, we discussed the following Hypertension-- ambulatory BPs readings reviewed, ~ 118/70, pulse 70s, 80s HYPERLIPIDEMIA-- on diet only,  very healthy diet, back exercising! Osteoarthritis, doing great!  Past Medical History: Hypertension Hyperlipidemia, borderline LBBB  Osteoarthritis, severe hip-back pain, saw  neurology, nerve conduction study 2/11 negative; Pain was due to  hip arthritis Neck fracture 04-2010 ---> Dr Yetta Barre, conservative treatnment  Urolithiasis x 1 2012    Past Surgical History: Tonsillectomy RIGHT hip replacement  May 11, 2009 @ [facility] LEFT hip replacement 10-2009   Family History: breast Ca--M  prostate ca--F? 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   Cardiovascular: Negative for chest pain and leg swelling.  Gastrointestinal: Negative for abdominal pain and blood in stool.    Genitourinary: Negative for dysuria, hematuria and difficulty urinating.       Objective:   Physical Exam  Constitutional: He is oriented to person, place, and time. He appears well-developed and well-nourished. No distress.  HENT:  Head: Normocephalic and atraumatic.  Neck: No thyromegaly present.       Normal carotid pulse  Cardiovascular: Normal rate, regular rhythm and normal heart sounds.   No murmur heard. Pulmonary/Chest: Effort normal and breath sounds normal. No respiratory distress. He has no rales.  Abdominal: Soft. He exhibits no distension. There is no tenderness. There is no rebound and no guarding.       No bruit  Genitourinary: Rectum normal and prostate normal.  Musculoskeletal: He exhibits no edema.  Neurological: He is alert and oriented to person, place, and time.  Skin: Skin is warm and dry. He is not diaphoretic.  Psychiatric: He has a normal mood and affect. His behavior is normal. Judgment and thought content normal.       Assessment & Plan:

## 2011-02-20 NOTE — Patient Instructions (Signed)
Check the  blood pressure 2 or 3 times a week, be sure it is less than 140/85. If it is consistently higher, let me know  

## 2011-02-20 NOTE — Assessment & Plan Note (Signed)
Seems to be doing great 

## 2011-02-24 MED ORDER — POTASSIUM CHLORIDE ER 10 MEQ PO TBCR: 10.0000 meq | EXTENDED_RELEASE_TABLET | Freq: Every day | ORAL | Status: DC

## 2011-02-24 NOTE — Progress Notes (Signed)
Quick Note:  Pt aware ______ 

## 2011-02-24 NOTE — Progress Notes (Signed)
Addended by: Azucena Freed on: 02/24/2011 05:24 PM   Modules accepted: Orders

## 2011-03-09 ENCOUNTER — Other Ambulatory Visit: Payer: Self-pay | Admitting: Internal Medicine

## 2011-03-09 DIAGNOSIS — I10 Essential (primary) hypertension: Secondary | ICD-10-CM

## 2011-03-10 ENCOUNTER — Other Ambulatory Visit (INDEPENDENT_AMBULATORY_CARE_PROVIDER_SITE_OTHER): Payer: Medicare Other

## 2011-03-10 DIAGNOSIS — I10 Essential (primary) hypertension: Secondary | ICD-10-CM | POA: Diagnosis not present

## 2011-03-10 LAB — BASIC METABOLIC PANEL
Calcium: 9.4 mg/dL (ref 8.4–10.5)
GFR: 88.36 mL/min (ref >60.00)
Glucose, Bld: 82 mg/dL (ref 70–99)
Potassium: 3.3 mEq/L — ABNORMAL LOW (ref 3.5–5.1)
Sodium: 138 mEq/L (ref 135–145)

## 2011-03-29 ENCOUNTER — Telehealth: Payer: Self-pay | Admitting: Internal Medicine

## 2011-03-29 DIAGNOSIS — I10 Essential (primary) hypertension: Secondary | ICD-10-CM

## 2011-03-29 NOTE — Telephone Encounter (Signed)
thank you, just needs labs , no need to see me unless he request a visit

## 2011-03-29 NOTE — Telephone Encounter (Signed)
Please call the patient, needs a potassium level checked, DX hypertension

## 2011-03-29 NOTE — Telephone Encounter (Signed)
Scheduled appt. For tomorrow at 9:30a.

## 2011-03-30 ENCOUNTER — Other Ambulatory Visit (INDEPENDENT_AMBULATORY_CARE_PROVIDER_SITE_OTHER): Payer: Medicare Other

## 2011-03-30 DIAGNOSIS — I10 Essential (primary) hypertension: Secondary | ICD-10-CM

## 2011-03-30 LAB — POTASSIUM: Potassium: 4.6 mEq/L (ref 3.5–5.1)

## 2011-03-30 NOTE — Progress Notes (Signed)
Labs only

## 2011-03-31 ENCOUNTER — Encounter: Payer: Self-pay | Admitting: *Deleted
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
## 2011-06-13 DIAGNOSIS — H2510 Age-related nuclear cataract, unspecified eye: Secondary | ICD-10-CM | POA: Diagnosis not present

## 2011-06-13 DIAGNOSIS — M169 Osteoarthritis of hip, unspecified: Secondary | ICD-10-CM | POA: Insufficient documentation

## 2011-06-13 HISTORY — DX: Osteoarthritis of hip, unspecified: M16.9

## 2011-06-16 DIAGNOSIS — Z96649 Presence of unspecified artificial hip joint: Secondary | ICD-10-CM | POA: Diagnosis not present

## 2011-06-16 DIAGNOSIS — M25559 Pain in unspecified hip: Secondary | ICD-10-CM | POA: Diagnosis not present

## 2011-06-16 DIAGNOSIS — M169 Osteoarthritis of hip, unspecified: Secondary | ICD-10-CM | POA: Diagnosis not present

## 2011-06-16 DIAGNOSIS — M1610 Unilateral primary osteoarthritis, unspecified hip: Secondary | ICD-10-CM | POA: Diagnosis not present

## 2011-07-17 DIAGNOSIS — H269 Unspecified cataract: Secondary | ICD-10-CM | POA: Diagnosis not present

## 2011-07-17 DIAGNOSIS — H2510 Age-related nuclear cataract, unspecified eye: Secondary | ICD-10-CM | POA: Diagnosis not present

## 2011-07-18 DIAGNOSIS — H2510 Age-related nuclear cataract, unspecified eye: Secondary | ICD-10-CM | POA: Diagnosis not present

## 2011-08-07 DIAGNOSIS — H269 Unspecified cataract: Secondary | ICD-10-CM | POA: Diagnosis not present

## 2011-08-07 DIAGNOSIS — H2510 Age-related nuclear cataract, unspecified eye: Secondary | ICD-10-CM | POA: Diagnosis not present

## 2011-08-11 ENCOUNTER — Other Ambulatory Visit: Payer: Self-pay | Admitting: Internal Medicine

## 2011-08-11 NOTE — Telephone Encounter (Signed)
Refill done.  

## 2011-09-23 ENCOUNTER — Other Ambulatory Visit: Payer: Self-pay | Admitting: Internal Medicine

## 2011-09-25 NOTE — Telephone Encounter (Signed)
Refill done.  

## 2012-03-22 ENCOUNTER — Encounter: Payer: Medicare Other | Admitting: Internal Medicine

## 2012-03-24 ENCOUNTER — Other Ambulatory Visit: Payer: Self-pay | Admitting: Internal Medicine

## 2012-03-26 NOTE — Telephone Encounter (Signed)
Refilled 1 month.  lmovm for pt to return call & re-schedule CPE.Marland Kitchen

## 2012-04-15 ENCOUNTER — Ambulatory Visit (INDEPENDENT_AMBULATORY_CARE_PROVIDER_SITE_OTHER): Payer: Medicare Other | Admitting: Internal Medicine

## 2012-04-15 ENCOUNTER — Encounter: Payer: Self-pay | Admitting: Internal Medicine

## 2012-04-15 VITALS — BP 122/78 | HR 88 | Temp 98.1°F | Wt 190.0 lb

## 2012-04-15 DIAGNOSIS — I447 Left bundle-branch block, unspecified: Secondary | ICD-10-CM

## 2012-04-15 DIAGNOSIS — I10 Essential (primary) hypertension: Secondary | ICD-10-CM

## 2012-04-15 DIAGNOSIS — N400 Enlarged prostate without lower urinary tract symptoms: Secondary | ICD-10-CM

## 2012-04-15 DIAGNOSIS — Z0000 Encounter for general adult medical examination without abnormal findings: Secondary | ICD-10-CM | POA: Diagnosis not present

## 2012-04-15 DIAGNOSIS — M1990 Unspecified osteoarthritis, unspecified site: Secondary | ICD-10-CM

## 2012-04-15 DIAGNOSIS — E785 Hyperlipidemia, unspecified: Secondary | ICD-10-CM

## 2012-04-15 DIAGNOSIS — G4700 Insomnia, unspecified: Secondary | ICD-10-CM

## 2012-04-15 NOTE — Assessment & Plan Note (Signed)
Asymptomatic, DRE normal last year, check a PSA. See instructions.

## 2012-04-15 NOTE — Assessment & Plan Note (Signed)
Well-controlled, no change, labs 

## 2012-04-15 NOTE — Assessment & Plan Note (Addendum)
Td --2004, shots unavailable today, see instructions  pneumonia shot--2007 had a  shingles shot before   DEXA 11-2006 normal  Cscope 08-2007 had a tubular adenoma, next Cscope per chart is in 2014-- refer to GI Doing great w/ diet-exercise labs

## 2012-04-15 NOTE — Patient Instructions (Addendum)
Please come back fasting CMP, CBC TSH --dx hypertension FLP--- dx  hyperlipidemia PSA --- dx prostate cancer screening  ---- The tetanus shot is not available today, please call in 2 weeks and scheduled to get one. ---   Fall Prevention and Home Safety Falls cause injuries and can affect all age groups. It is possible to use preventive measures to significantly decrease the likelihood of falls. There are many simple measures which can make your home safer and prevent falls. OUTDOORS  Repair cracks and edges of walkways and driveways.  Remove high doorway thresholds.  Trim shrubbery on the main path into your home.  Have good outside lighting.  Clear walkways of tools, rocks, debris, and clutter.  Check that handrails are not broken and are securely fastened. Both sides of steps should have handrails.  Have leaves, snow, and ice cleared regularly.  Use sand or salt on walkways during winter months.  In the garage, clean up grease or oil spills. BATHROOM  Install night lights.  Install grab bars by the toilet and in the tub and shower.  Use non-skid mats or decals in the tub or shower.  Place a plastic non-slip stool in the shower to sit on, if needed.  Keep floors dry and clean up all water on the floor immediately.  Remove soap buildup in the tub or shower on a regular basis.  Secure bath mats with non-slip, double-sided rug tape.  Remove throw rugs and tripping hazards from the floors. BEDROOMS  Install night lights.  Make sure a bedside light is easy to reach.  Do not use oversized bedding.  Keep a telephone by your bedside.  Have a firm chair with side arms to use for getting dressed.  Remove throw rugs and tripping hazards from the floor. KITCHEN  Keep handles on pots and pans turned toward the center of the stove. Use back burners when possible.  Clean up spills quickly and allow time for drying.  Avoid walking on wet floors.  Avoid hot  utensils and knives.  Position shelves so they are not too high or low.  Place commonly used objects within easy reach.  If necessary, use a sturdy step stool with a grab bar when reaching.  Keep electrical cables out of the way.  Do not use floor polish or wax that makes floors slippery. If you must use wax, use non-skid floor wax.  Remove throw rugs and tripping hazards from the floor. STAIRWAYS  Never leave objects on stairs.  Place handrails on both sides of stairways and use them. Fix any loose handrails. Make sure handrails on both sides of the stairways are as long as the stairs.  Check carpeting to make sure it is firmly attached along stairs. Make repairs to worn or loose carpet promptly.  Avoid placing throw rugs at the top or bottom of stairways, or properly secure the rug with carpet tape to prevent slippage. Get rid of throw rugs, if possible.  Have an electrician put in a light switch at the top and bottom of the stairs. OTHER FALL PREVENTION TIPS  Wear low-heel or rubber-soled shoes that are supportive and fit well. Wear closed toe shoes.  When using a stepladder, make sure it is fully opened and both spreaders are firmly locked. Do not climb a closed stepladder.  Add color or contrast paint or tape to grab bars and handrails in your home. Place contrasting color strips on first and last steps.  Learn and use mobility aids  as needed. Install an electrical emergency response system.  Turn on lights to avoid dark areas. Replace light bulbs that burn out immediately. Get light switches that glow.  Arrange furniture to create clear pathways. Keep furniture in the same place.  Firmly attach carpet with non-skid or double-sided tape.  Eliminate uneven floor surfaces.  Select a carpet pattern that does not visually hide the edge of steps.  Be aware of all pets. OTHER HOME SAFETY TIPS  Set the water temperature for 120 F (48.8 C).  Keep emergency numbers on  or near the telephone.  Keep smoke detectors on every level of the home and near sleeping areas. Document Released: 01/13/2002 Document Revised: 07/25/2011 Document Reviewed: 04/14/2011 [facility] Patient Information 2013 [place], [place].

## 2012-04-15 NOTE — Assessment & Plan Note (Signed)
Due for labs, on life style treatment

## 2012-04-15 NOTE — Assessment & Plan Note (Signed)
Doing great, no change
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
## 2012-04-15 NOTE — Assessment & Plan Note (Signed)
Not an issue

## 2012-04-15 NOTE — Assessment & Plan Note (Addendum)
EKG today @ baseline

## 2012-04-18 ENCOUNTER — Other Ambulatory Visit (INDEPENDENT_AMBULATORY_CARE_PROVIDER_SITE_OTHER): Payer: Medicare Other

## 2012-04-18 DIAGNOSIS — Z125 Encounter for screening for malignant neoplasm of prostate: Secondary | ICD-10-CM | POA: Diagnosis not present

## 2012-04-18 DIAGNOSIS — E785 Hyperlipidemia, unspecified: Secondary | ICD-10-CM

## 2012-04-18 DIAGNOSIS — I10 Essential (primary) hypertension: Secondary | ICD-10-CM

## 2012-04-18 LAB — COMPREHENSIVE METABOLIC PANEL
ALT: 25 U/L (ref 0–53)
AST: 23 U/L (ref 0–37)
Albumin: 4.3 g/dL (ref 3.5–5.2)
Alkaline Phosphatase: 53 U/L (ref 39–117)
BUN: 17 mg/dL (ref 6–23)
Calcium: 9.7 mg/dL (ref 8.4–10.5)
Chloride: 99 mEq/L (ref 96–112)
Potassium: 3.7 mEq/L (ref 3.5–5.1)
Sodium: 138 mEq/L (ref 135–145)
Total Protein: 7.2 g/dL (ref 6.0–8.3)

## 2012-04-18 LAB — CBC WITH DIFFERENTIAL/PLATELET
Basophils Absolute: 0 10*3/uL (ref 0.0–0.1)
Basophils Relative: 0.4 % (ref 0.0–3.0)
Eosinophils Absolute: 0.3 10*3/uL (ref 0.0–0.7)
Lymphocytes Relative: 19 % (ref 12.0–46.0)
MCHC: 34.7 g/dL (ref 30.0–36.0)
MCV: 98.9 fl (ref 78.0–100.0)
Monocytes Absolute: 0.9 10*3/uL (ref 0.1–1.0)
Neutrophils Relative %: 64.6 % (ref 43.0–77.0)
Platelets: 276 10*3/uL (ref 150.0–400.0)
RDW: 13.1 % (ref 11.5–14.6)

## 2012-04-18 LAB — LIPID PANEL
Cholesterol: 200 mg/dL (ref 0–200)
LDL Cholesterol: 123 mg/dL — ABNORMAL HIGH (ref 0–99)
Triglycerides: 70 mg/dL (ref 0.0–149.0)

## 2012-04-19 ENCOUNTER — Encounter: Payer: Self-pay | Admitting: Internal Medicine

## 2012-04-23 ENCOUNTER — Other Ambulatory Visit: Payer: Self-pay | Admitting: Internal Medicine

## 2012-04-23 NOTE — Telephone Encounter (Signed)
Refill done.  

## 2012-05-09 ENCOUNTER — Ambulatory Visit (INDEPENDENT_AMBULATORY_CARE_PROVIDER_SITE_OTHER): Payer: Medicare Other

## 2012-05-09 DIAGNOSIS — Z23 Encounter for immunization: Secondary | ICD-10-CM

## 2012-05-21 ENCOUNTER — Encounter: Payer: Self-pay | Admitting: Gastroenterology

## 2012-06-12 ENCOUNTER — Other Ambulatory Visit: Payer: Self-pay | Admitting: Internal Medicine

## 2012-06-12 NOTE — Telephone Encounter (Signed)
Refill done.  

## 2012-07-30 ENCOUNTER — Ambulatory Visit (AMBULATORY_SURGERY_CENTER): Payer: Medicare Other | Admitting: *Deleted

## 2012-07-30 VITALS — Ht 74.0 in | Wt 191.6 lb

## 2012-07-30 DIAGNOSIS — Z1211 Encounter for screening for malignant neoplasm of colon: Secondary | ICD-10-CM

## 2012-07-30 MED ORDER — MOVIPREP 100 G PO SOLR: ORAL | Status: DC

## 2012-08-13 ENCOUNTER — Encounter: Payer: Self-pay | Admitting: Gastroenterology

## 2012-08-13 ENCOUNTER — Ambulatory Visit (AMBULATORY_SURGERY_CENTER): Payer: Medicare Other | Admitting: Gastroenterology

## 2012-08-13 VITALS — BP 113/65 | HR 71 | Temp 96.8°F | Resp 18 | Ht 74.0 in | Wt 191.0 lb

## 2012-08-13 DIAGNOSIS — D649 Anemia, unspecified: Secondary | ICD-10-CM | POA: Diagnosis not present

## 2012-08-13 DIAGNOSIS — D126 Benign neoplasm of colon, unspecified: Secondary | ICD-10-CM

## 2012-08-13 DIAGNOSIS — Z86010 Personal history of colonic polyps: Secondary | ICD-10-CM

## 2012-08-13 DIAGNOSIS — I10 Essential (primary) hypertension: Secondary | ICD-10-CM | POA: Diagnosis not present

## 2012-08-13 DIAGNOSIS — K635 Polyp of colon: Secondary | ICD-10-CM | POA: Insufficient documentation

## 2012-08-13 DIAGNOSIS — I447 Left bundle-branch block, unspecified: Secondary | ICD-10-CM | POA: Diagnosis not present

## 2012-08-13 HISTORY — DX: Polyp of colon: K63.5

## 2012-08-13 MED ORDER — SODIUM CHLORIDE 0.9 % IV SOLN: 500.0000 mL | INTRAVENOUS | Status: DC

## 2012-08-13 NOTE — Patient Instructions (Addendum)

## 2012-08-13 NOTE — Progress Notes (Signed)
Patient did not have preoperative order for IV antibiotic SSI prophylaxis. (G8918)  Patient did not experience any of the following events: a burn prior to discharge; a fall within the facility; wrong site/side/patient/procedure/implant event; or a hospital transfer or hospital admission upon discharge from the facility. (G8907)  

## 2012-08-13 NOTE — Op Note (Signed)
[facility] [address] [place], [address]   COLONOSCOPY PROCEDURE REPORT  PATIENT: Chase Hernandez, Chase Hernandez  MR#: [id number] BIRTHDATE: 15-Mar-1933 , 79  yrs. old GENDER: Male ENDOSCOPIST: Meryl Dare, MD, [facility] PROCEDURE DATE:  08/13/2012 PROCEDURE:   Colonoscopy with snare polypectomy ASA CLASS:   Class II INDICATIONS:Patient's personal history of adenomatous colon polyps and Last colonoscopy performed 5 years ago. MEDICATIONS: MAC sedation, administered by CRNA and propofol (Diprivan) 300mg  IV DESCRIPTION OF PROCEDURE:   After the risks benefits and alternatives of the procedure were thoroughly explained, informed consent was obtained.  A digital rectal exam revealed no abnormalities of the rectum.   The LB [id number]  endoscope was introduced through the anus and advanced to the cecum, which was identified by both the appendix and ileocecal valve. No adverse events experienced.   The quality of the prep was good, using MoviPrep  The instrument was then slowly withdrawn as the colon was fully examined.  COLON FINDINGS: Two sessile polyps measuring 5-6 mm in size were found in the transverse colon.  A polypectomy was performed with a cold snare.  The resection was complete and the polyp tissue was completely retrieved.   Mild melanosis was found at the cecum and in the ascending colon.   The colon was otherwise normal.  There was no diverticulosis, inflammation, polyps or cancers unless previously stated.  Retroflexed views revealed small internal hemorrhoids. The time to cecum=3 minutes 32 seconds.  Withdrawal time=10 minutes 09 seconds.  The scope was withdrawn and the procedure completed.  COMPLICATIONS: There were no complications.  ENDOSCOPIC IMPRESSION: 1.   Two sessile polyps measuring 5-6 mm in the transverse colon; polypectomy performed with a cold snare 2.   Mild melanosis was found at the cecum and in the ascending colon 3.   Small  internal hemorrhoids  RECOMMENDATIONS: 1.  Await pathology results 2.  High fiber diet with liberal fluid intake. 3.  No plans for future screening or surveillance colonoscopies due to age. These type of exam usually stop around age 49.  eSigned:  Meryl Dare, MD, [facility] 08/13/2012 10:33 AM

## 2012-08-13 NOTE — Progress Notes (Signed)
Called to room to assist during endoscopic procedure.  Patient ID and intended procedure confirmed with present staff. Received instructions for my participation in the procedure from the performing physician.  

## 2012-08-14 ENCOUNTER — Telehealth: Payer: Self-pay | Admitting: *Deleted

## 2012-08-14 NOTE — Telephone Encounter (Signed)
Left message on number given in admitting yesterday to return call if concerns, problems, or questions. ewm

## 2012-08-20 ENCOUNTER — Encounter: Payer: Self-pay | Admitting: Gastroenterology

## 2012-11-08 DIAGNOSIS — Z23 Encounter for immunization: Secondary | ICD-10-CM | POA: Diagnosis not present

## 2012-11-22 ENCOUNTER — Other Ambulatory Visit: Payer: Self-pay | Admitting: Internal Medicine

## 2012-11-22 NOTE — Telephone Encounter (Signed)
rx refilled per protocol. DJR  

## 2012-12-26 ENCOUNTER — Other Ambulatory Visit: Payer: Self-pay | Admitting: Internal Medicine

## 2012-12-26 NOTE — Telephone Encounter (Signed)
Klor-con refilled per protocol

## 2013-01-23 ENCOUNTER — Other Ambulatory Visit: Payer: Self-pay | Admitting: Internal Medicine

## 2013-01-23 NOTE — Telephone Encounter (Signed)
rx refilled per protocol. DJR  

## 2013-02-20 ENCOUNTER — Other Ambulatory Visit: Payer: Self-pay | Admitting: Internal Medicine

## 2013-03-20 ENCOUNTER — Other Ambulatory Visit: Payer: Self-pay | Admitting: Internal Medicine

## 2013-04-17 ENCOUNTER — Other Ambulatory Visit: Payer: Self-pay | Admitting: Internal Medicine

## 2013-05-15 ENCOUNTER — Other Ambulatory Visit: Payer: Self-pay | Admitting: Internal Medicine

## 2013-05-22 ENCOUNTER — Telehealth: Payer: Self-pay

## 2013-05-22 NOTE — Telephone Encounter (Signed)
Medication List and allergies:  Reviewed and updated  90 day supply/mail order: na Local prescriptions: [facility] [address]  Immunizations due: no flu vaccine this season  A/P:   No changes to FH, PSH or Personal Hx Flu vaccine--not this season Tdap--2014 PNA--2007 Shingles--2010 [facility]--2014--Dr Stark--adenomatous polyps--not follow up noted PSA--04/2012--2.28 BD--nml--11/2006  To Discuss with Provider: [name] to be coming in to see his favorite doctor.

## 2013-05-23 ENCOUNTER — Ambulatory Visit (INDEPENDENT_AMBULATORY_CARE_PROVIDER_SITE_OTHER): Payer: Medicare Other | Admitting: Internal Medicine

## 2013-05-23 ENCOUNTER — Encounter: Payer: Self-pay | Admitting: Internal Medicine

## 2013-05-23 VITALS — BP 133/76 | HR 84 | Temp 98.2°F | Ht 72.2 in | Wt 186.0 lb

## 2013-05-23 DIAGNOSIS — Z0000 Encounter for general adult medical examination without abnormal findings: Secondary | ICD-10-CM

## 2013-05-23 DIAGNOSIS — Z23 Encounter for immunization: Secondary | ICD-10-CM

## 2013-05-23 DIAGNOSIS — N400 Enlarged prostate without lower urinary tract symptoms: Secondary | ICD-10-CM | POA: Diagnosis not present

## 2013-05-23 DIAGNOSIS — H9190 Unspecified hearing loss, unspecified ear: Secondary | ICD-10-CM

## 2013-05-23 DIAGNOSIS — E785 Hyperlipidemia, unspecified: Secondary | ICD-10-CM | POA: Diagnosis not present

## 2013-05-23 DIAGNOSIS — I10 Essential (primary) hypertension: Secondary | ICD-10-CM | POA: Diagnosis not present

## 2013-05-23 NOTE — Assessment & Plan Note (Addendum)
Good med compliance, ambulatory BPs range from 120- 114/50, 70. Pulse in the 80s. Plan: No change, labs

## 2013-05-23 NOTE — Assessment & Plan Note (Addendum)
Td --2014 pneumonia shot--2007 had a  Zostavax   DEXA 11-2006 normal  Cscope 08-2007 had a tubular adenoma  CCS--2014--Dr Stark--adenomatous polyps--not follow up noted  Doing great w/ diet-exercise labs

## 2013-05-23 NOTE — Patient Instructions (Signed)
Please come back fasting: FLP hyperlipidemia CMP blood pressure  Next visit in one year  Check the  blood pressure 2 or 3 times a month   be sure it is between 110/60 and 140/85. Ideal blood pressure is 120/80. If it is consistently higher or lower, let me know        Fall Prevention and Home Safety Falls cause injuries and can affect all age groups. It is possible to use preventive measures to significantly decrease the likelihood of falls. There are many simple measures which can make your home safer and prevent falls. OUTDOORS  Repair cracks and edges of walkways and driveways.  Remove high doorway thresholds.  Trim shrubbery on the main path into your home.  Have good outside lighting.  Clear walkways of tools, rocks, debris, and clutter.  Check that handrails are not broken and are securely fastened. Both sides of steps should have handrails.  Have leaves, snow, and ice cleared regularly.  Use sand or salt on walkways during winter months.  In the garage, clean up grease or oil spills. BATHROOM  Install night lights.  Install grab bars by the toilet and in the tub and shower.  Use non-skid mats or decals in the tub or shower.  Place a plastic non-slip stool in the shower to sit on, if needed.  Keep floors dry and clean up all water on the floor immediately.  Remove soap buildup in the tub or shower on a regular basis.  Secure bath mats with non-slip, double-sided rug tape.  Remove throw rugs and tripping hazards from the floors. BEDROOMS  Install night lights.  Make sure a bedside light is easy to reach.  Do not use oversized bedding.  Keep a telephone by your bedside.  Have a firm chair with side arms to use for getting dressed.  Remove throw rugs and tripping hazards from the floor. KITCHEN  Keep handles on pots and pans turned toward the center of the stove. Use back burners when possible.  Clean up spills quickly and allow time for  drying.  Avoid walking on wet floors.  Avoid hot utensils and knives.  Position shelves so they are not too high or low.  Place commonly used objects within easy reach.  If necessary, use a sturdy step stool with a grab bar when reaching.  Keep electrical cables out of the way.  Do not use floor polish or wax that makes floors slippery. If you must use wax, use non-skid floor wax.  Remove throw rugs and tripping hazards from the floor. STAIRWAYS  Never leave objects on stairs.  Place handrails on both sides of stairways and use them. Fix any loose handrails. Make sure handrails on both sides of the stairways are as long as the stairs.  Check carpeting to make sure it is firmly attached along stairs. Make repairs to worn or loose carpet promptly.  Avoid placing throw rugs at the top or bottom of stairways, or properly secure the rug with carpet tape to prevent slippage. Get rid of throw rugs, if possible.  Have an electrician put in a light switch at the top and bottom of the stairs. OTHER FALL PREVENTION TIPS  Wear low-heel or rubber-soled shoes that are supportive and fit well. Wear closed toe shoes.  When using a stepladder, make sure it is fully opened and both spreaders are firmly locked. Do not climb a closed stepladder.  Add color or contrast paint or tape to grab bars and handrails  in your home. Place contrasting color strips on first and last steps.  Learn and use mobility aids as needed. Install an electrical emergency response system.  Turn on lights to avoid dark areas. Replace light bulbs that burn out immediately. Get light switches that glow.  Arrange furniture to create clear pathways. Keep furniture in the same place.  Firmly attach carpet with non-skid or double-sided tape.  Eliminate uneven floor surfaces.  Select a carpet pattern that does not visually hide the edge of steps.  Be aware of all pets. OTHER HOME SAFETY TIPS  Set the water temperature  for 120 F (48.8 C).  Keep emergency numbers on or near the telephone.  Keep smoke detectors on every level of the home and near sleeping areas. Document Released: 01/13/2002 Document Revised: 07/25/2011 Document Reviewed: 04/14/2011 [facility] Patient Information 2014 [place].

## 2013-05-23 NOTE — Progress Notes (Signed)
Pre visit review using our clinic review tool, if applicable. No additional management support is needed unless otherwise documented below in the visit note. 

## 2013-05-23 NOTE — Assessment & Plan Note (Addendum)
Father had prostate ca late in life. Asymptomatic, PSAs have been within normal in the last few years. Consider a PSA next year

## 2013-05-23 NOTE — Progress Notes (Signed)
Subjective:    Patient ID: Chase Hernandez, male    DOB: 12-28-1933, 80 y.o.   MRN: [id number]  DOS:  05/23/2013 Type of  visit:  Here for Medicare AWV:  1. Risk factors based on Past M, S, F history: reviewed  2. 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 Depression/mood: (-) screening  4. Hearing: slt decrease hearing x 2 years, reports tinnitus,  Refer to [facility]  5. ADL's: independent , drives w/o problems  6. Fall Risk: no recent falls, see instructions  7. Home Safety: does feel safe at home  8. Height, weight, &visual acuity: see VS, s/p cataract surgery, vision is great  9. Counseling: yes  10. Labs ordered based on risk factors: yes  11. Referral Coordination, if needed  12. are Plan, see a/p  13. Cognitive Assessment: cognition, motor skills and memory seem normal   in addition, we discussed the following  DJD, to see orthopedic surgery May 2015 for a two-year checkup. Other than that essentially asymptomatic, some aches and pains after he is active. History of urolithiasis, asymptomatic Hyperlipidemia, on no medications, has a very healthy lifestyle. Hypertension, excellent ambulatory BPs, see assessment and plan.   ROS No  CP, SOB No palpitations, no lower extremity edema Denies  nausea, vomiting diarrhea Denies  blood in the stools (-) cough, sputum production (-) wheezing, chest congestion No dysuria, gross hematuria, difficulty urinating  No headaches Denies dizziness   Past Medical History  Diagnosis Date  . Hypertension   . Hyperlipidemia   . LBBB (left bundle branch block)   . Osteoarthritis     severe hip-back pain, saw neurology, nerve conduction study 2/11 negative; pain was due to hip arthritis  . Neck fracture 04-2010    Dr Ronnald Ramp, conservative treatnment  . Kidney stones 2012    h/o    Past Surgical History  Procedure Laterality Date  . Tonsillectomy    . Total hip arthroplasty     right, surgery April 5,2011 @ [facility]  . Total hip arthroplasty  10/2009    left  . Cataract extraction w/ intraocular lens  implant, bilateral  2013    History   Social History  . Marital Status: Married    Spouse Name: N/A    Number of Children: 1  . Years of Education: N/A   Occupational History  . 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     Social History Main Topics  . Smoking status: Never Smoker   . Smokeless tobacco: Never Used  . Alcohol Use: No  . Drug Use: No  . Sexual Activity: Not on file   Other Topics Concern  . 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 Breast cancer Mother   . Diabetes      GM  . Heart attack Neg Hx   . Prostate cancer Father   . Colon cancer Neg Hx        Medication List       This list is accurate as of: 05/23/13 11:59 PM.  Always use your most recent med list.  atenolol-chlorthalidone 50-25 MG per tablet  Commonly known as:  TENORETIC  TAKE 1/2 TABLET BY MOUTH DAILY     GLUCOSAMINE HCL PO  Take by mouth daily.     potassium chloride 10 MEQ tablet  Commonly known as:  KLOR-CON 10  Take 1 tablet by mouth daily.     ZINC PO  Take by mouth.           Objective:   Physical Exam BP 133/76  Pulse 84  Temp(Src) 98.2 F (36.8 C)  Ht 6' 0.2" (1.834 m)  Wt 186 lb (84.369 kg)  BMI 25.08 kg/m2  SpO2 100% General -- alert, well-developed, NAD.  Neck --no thyromegaly , normal carotid pulse  HEENT-- Not pale. T Lungs -- normal respiratory effort, no intercostal retractions, no accessory muscle use, and normal breath sounds.  Heart-- normal rate, regular rhythm, no murmur.  Abdomen-- Not distended, good bowel sounds,soft, non-tender. No bruit, no  mass Extremities-- no pretibial edema bilaterally  Neurologic--  alert & oriented X3. Speech normal, gait normal, strength normal in all extremities.   Psych-- Cognition and judgment appear intact. Cooperative with normal attention span and concentration. No anxious or depressed appearing.       Assessment & Plan:

## 2013-05-23 NOTE — Assessment & Plan Note (Signed)
Excellent lifestyle, labs

## 2013-05-25 ENCOUNTER — Encounter: Payer: Self-pay | Admitting: Internal Medicine

## 2013-05-26 ENCOUNTER — Telehealth: Payer: Self-pay | Admitting: Internal Medicine

## 2013-05-26 ENCOUNTER — Other Ambulatory Visit (INDEPENDENT_AMBULATORY_CARE_PROVIDER_SITE_OTHER): Payer: Medicare Other

## 2013-05-26 DIAGNOSIS — E785 Hyperlipidemia, unspecified: Secondary | ICD-10-CM

## 2013-05-26 DIAGNOSIS — I10 Essential (primary) hypertension: Secondary | ICD-10-CM | POA: Diagnosis not present

## 2013-05-26 LAB — COMPREHENSIVE METABOLIC PANEL
ALK PHOS: 60 U/L (ref 39–117)
ALT: 30 U/L (ref 0–53)
AST: 24 U/L (ref 0–37)
Albumin: 4.2 g/dL (ref 3.5–5.2)
BILIRUBIN TOTAL: 0.6 mg/dL (ref 0.3–1.2)
BUN: 17 mg/dL (ref 6–23)
CO2: 33 mEq/L — ABNORMAL HIGH (ref 19–32)
Calcium: 9.7 mg/dL (ref 8.4–10.5)
Chloride: 97 mEq/L (ref 96–112)
Creatinine, Ser: 0.9 mg/dL (ref 0.4–1.5)
GFR: 85.62 mL/min (ref >60.00)
Glucose, Bld: 91 mg/dL (ref 70–99)
Potassium: 4.7 mEq/L (ref 3.5–5.1)
Sodium: 138 mEq/L (ref 135–145)
Total Protein: 7.5 g/dL (ref 6.0–8.3)

## 2013-05-26 LAB — LIPID PANEL
CHOLESTEROL: 200 mg/dL (ref 0–200)
HDL: 63.1 mg/dL (ref >39.00)
LDL CALC: 118 mg/dL — AB (ref 0–99)
TRIGLYCERIDES: 95 mg/dL (ref 0.0–149.0)
Total CHOL/HDL Ratio: 3
VLDL: 19 mg/dL (ref 0.0–40.0)

## 2013-05-26 NOTE — Telephone Encounter (Signed)
emmi mailed to patient °

## 2013-05-28 ENCOUNTER — Encounter: Payer: Self-pay | Admitting: *Deleted

## 2013-05-30 ENCOUNTER — Telehealth: Payer: Self-pay | Admitting: Internal Medicine

## 2013-05-30 NOTE — Telephone Encounter (Signed)
Results mailed 

## 2013-05-30 NOTE — Telephone Encounter (Signed)
Caller name: Jakari Relation to [id number] PCP: Call back [place]:  Reason for call:   Got information regarding labs in the mail, but the information did not give the detailed results for the test (actual numbers).  Pt would to like to have this mailed to him as well.

## 2013-06-09 DIAGNOSIS — M1610 Unilateral primary osteoarthritis, unspecified hip: Secondary | ICD-10-CM | POA: Diagnosis not present

## 2013-06-09 DIAGNOSIS — Z96649 Presence of unspecified artificial hip joint: Secondary | ICD-10-CM | POA: Diagnosis not present

## 2013-06-09 DIAGNOSIS — M25559 Pain in unspecified hip: Secondary | ICD-10-CM | POA: Diagnosis not present

## 2013-06-09 DIAGNOSIS — M169 Osteoarthritis of hip, unspecified: Secondary | ICD-10-CM | POA: Diagnosis not present

## 2013-06-16 ENCOUNTER — Other Ambulatory Visit: Payer: Self-pay | Admitting: Internal Medicine

## 2013-06-27 DIAGNOSIS — H903 Sensorineural hearing loss, bilateral: Secondary | ICD-10-CM | POA: Diagnosis not present

## 2013-07-24 ENCOUNTER — Other Ambulatory Visit: Payer: Self-pay | Admitting: Internal Medicine

## 2013-08-20 ENCOUNTER — Other Ambulatory Visit: Payer: Self-pay | Admitting: *Deleted

## 2013-08-20 MED ORDER — POTASSIUM CHLORIDE ER 10 MEQ PO TBCR: EXTENDED_RELEASE_TABLET | ORAL | Status: DC

## 2013-12-26 DIAGNOSIS — Z23 Encounter for immunization: Secondary | ICD-10-CM | POA: Diagnosis not present

## 2014-02-23 ENCOUNTER — Other Ambulatory Visit: Payer: Self-pay | Admitting: Internal Medicine

## 2014-05-26 ENCOUNTER — Telehealth: Payer: Self-pay | Admitting: *Deleted

## 2014-05-26 NOTE — Telephone Encounter (Signed)
Pre visit completed 

## 2014-05-26 NOTE — Telephone Encounter (Signed)
Unable to reach pt at this time. Will try again.

## 2014-05-27 ENCOUNTER — Encounter: Payer: Self-pay | Admitting: Internal Medicine

## 2014-05-27 ENCOUNTER — Ambulatory Visit (INDEPENDENT_AMBULATORY_CARE_PROVIDER_SITE_OTHER): Payer: Medicare Other | Admitting: Internal Medicine

## 2014-05-27 VITALS — BP 110/72 | HR 93 | Temp 98.1°F | Ht 72.0 in | Wt 184.1 lb

## 2014-05-27 DIAGNOSIS — Z0000 Encounter for general adult medical examination without abnormal findings: Secondary | ICD-10-CM

## 2014-05-27 DIAGNOSIS — D509 Iron deficiency anemia, unspecified: Secondary | ICD-10-CM | POA: Diagnosis not present

## 2014-05-27 DIAGNOSIS — E785 Hyperlipidemia, unspecified: Secondary | ICD-10-CM | POA: Diagnosis not present

## 2014-05-27 DIAGNOSIS — N400 Enlarged prostate without lower urinary tract symptoms: Secondary | ICD-10-CM

## 2014-05-27 DIAGNOSIS — Z125 Encounter for screening for malignant neoplasm of prostate: Secondary | ICD-10-CM

## 2014-05-27 DIAGNOSIS — I10 Essential (primary) hypertension: Secondary | ICD-10-CM

## 2014-05-27 LAB — BASIC METABOLIC PANEL
BUN: 17 mg/dL (ref 6–23)
CO2: 30 mEq/L (ref 19–32)
Calcium: 9.6 mg/dL (ref 8.4–10.5)
Chloride: 99 mEq/L (ref 96–112)
Creatinine, Ser: 0.98 mg/dL (ref 0.40–1.50)
GFR: 78.4 mL/min (ref >60.00)
Glucose, Bld: 98 mg/dL (ref 70–99)
POTASSIUM: 3.5 meq/L (ref 3.5–5.1)
SODIUM: 137 meq/L (ref 135–145)

## 2014-05-27 LAB — CBC WITH DIFFERENTIAL/PLATELET
BASOS ABS: 0 10*3/uL (ref 0.0–0.1)
Basophils Relative: 0.8 % (ref 0.0–3.0)
EOS ABS: 0.2 10*3/uL (ref 0.0–0.7)
Eosinophils Relative: 3.8 % (ref 0.0–5.0)
HCT: 38.3 % — ABNORMAL LOW (ref 39.0–52.0)
Hemoglobin: 12.9 g/dL — ABNORMAL LOW (ref 13.0–17.0)
LYMPHS PCT: 18.8 % (ref 12.0–46.0)
Lymphs Abs: 1.1 10*3/uL (ref 0.7–4.0)
MCHC: 33.7 g/dL (ref 30.0–36.0)
MCV: 94.8 fl (ref 78.0–100.0)
MONOS PCT: 13.7 % — AB (ref 3.0–12.0)
Monocytes Absolute: 0.8 10*3/uL (ref 0.1–1.0)
NEUTROS PCT: 62.9 % (ref 43.0–77.0)
Neutro Abs: 3.6 10*3/uL (ref 1.4–7.7)
Platelets: 305 10*3/uL (ref 150.0–400.0)
RBC: 4.04 Mil/uL — ABNORMAL LOW (ref 4.22–5.81)
RDW: 12.9 % (ref 11.5–15.5)
WBC: 5.8 10*3/uL (ref 4.0–10.5)

## 2014-05-27 LAB — PSA: PSA: 2.54 ng/mL (ref 0.10–4.00)

## 2014-05-27 MED ORDER — POTASSIUM CHLORIDE ER 10 MEQ PO TBCR: 10.0000 meq | EXTENDED_RELEASE_TABLET | Freq: Every day | ORAL | Status: DC

## 2014-05-27 MED ORDER — ATENOLOL-CHLORTHALIDONE 50-25 MG PO TABS: 0.5000 | ORAL_TABLET | Freq: Every day | ORAL | Status: DC

## 2014-05-27 NOTE — Assessment & Plan Note (Signed)
Well controlled with current medications, check a BMP and refill meds. Check ambulatory BPs, follow-up one year

## 2014-05-27 NOTE — Assessment & Plan Note (Signed)
Prostate exam today normal, he does have a history of BPH but he's asymptomatic. + FH prostate cancer, father in his late 22s He is 81 years old and highly functional, will check a PSA this year, consider recheck a PSA in 2 years.

## 2014-05-27 NOTE — Assessment & Plan Note (Addendum)
Td --2014 pneumonia shot--2007 prevnar-- 2013  had a  Zostavax 2010  DEXA 11-2006 normal  Cscope 08-2007 had a tubular adenoma  CCS--2014--Dr Stark--adenomatous polyps--not follow up noted  Doing great w/ diet-exercise labs

## 2014-05-27 NOTE — Progress Notes (Signed)
Subjective:    Patient ID: Chase Hernandez, male    DOB: 1933-03-23, 81 y.o.   MRN: [id number]  DOS:  05/27/2014 Type of visit - description :    Here for Medicare AWV:  1. Risk factors based on Past M, S, F history: reviewed  2. 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 Depression/mood: (-) screening  4. Hearing: hearing aids since 2015 6. Fall Risk: no recent falls, see instructions  7. Home Safety: does feel safe at home  8. Height, weight, &visual acuity: see VS, s/p cataract surgery, vision is great  9. Counseling: yes  10. Labs ordered based on risk factors: yes  11. Referral Coordination, if needed  12. are Plan, see a/p  13. Cognitive Assessment: cognition, motor skills and memory seem normal  14. Care team updated  15. End of life care , already has a health care power of attorney  in addition, we discussed the following  High blood pressure, good compliance with medications, ambulatory BPs under excellent control  Review of Systems Constitutional: No fever, chills. No unexplained wt changes. No unusual sweats HEENT: No dental problems, ear discharge, facial swelling, voice changes. No eye discharge, redness or intolerance to light Respiratory: No wheezing or difficulty breathing. No cough , mucus production Cardiovascular: No CP, leg swelling or palpitations GI: no nausea, vomiting, diarrhea or abdominal pain.  No blood in the stools. No dysphagia   Endocrine: No polyphagia, polyuria or polydipsia GU: No dysuria, gross hematuria, difficulty urinating. No urinary urgency or frequency. Musculoskeletal: No joint swellings or unusual aches or pains Skin: No change in the color of the skin, palor or rash Allergic, immunologic: No environmental allergies or food allergies Neurological: No dizziness or syncope. No headaches. No diplopia, slurred speech, motor deficits, facial numbness Hematological: No enlarged  lymph nodes, easy bruising or bleeding Psychiatry: No suicidal ideas, hallucinations, behavior problems or confusion. No unusual/severe anxiety or depression.     Past Medical History  Diagnosis Date  . Hypertension   . Hyperlipidemia   . LBBB (left bundle branch block)   . Osteoarthritis     severe hip-back pain, saw neurology, nerve conduction study 2/11 negative; pain was due to hip arthritis  . Neck fracture 04-2010    Dr Ronnald Ramp, conservative treatnment  . Kidney stones 2012    h/o    Past Surgical History  Procedure Laterality Date  . Tonsillectomy    . Total hip arthroplasty      right, surgery April 5,2011 @ [facility]  . Total hip arthroplasty  10/2009    left  . Cataract extraction w/ intraocular lens  implant, bilateral  2013    History   Social History  . Marital Status: Married    Spouse Name: N/A  . Number of Children: 1  . Years of Education: N/A   Occupational History  . 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     Social History Main Topics  . Smoking status: Never Smoker   . Smokeless tobacco: Never Used  . Alcohol Use: No  . Drug Use: No  . Sexual Activity: Not on file   Other Topics Concern  . 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  Always use your most recent med list.               atenolol-chlorthalidone 50-25 MG per tablet  Commonly known as:  TENORETIC  Take 0.5 tablets by mouth daily.     GLUCOSAMINE HCL PO  Take by mouth daily.     potassium chloride 10 MEQ tablet  Commonly known as:  KLOR-CON 10  Take 1 tablet (10 mEq total) by mouth daily.     ZINC PO  Take by mouth.       Family History  Problem Relation Age of Onset  . Breast cancer  Mother   . Diabetes      GM  . Heart attack Neg Hx   . Prostate cancer Father 5  . Colon cancer Neg Hx        Objective:   Physical Exam BP 110/72 mmHg  Pulse 93  Temp(Src) 98.1 F (36.7 C) (Oral)  Ht 6' (1.829 m)  Wt 184 lb 2 oz (83.519 kg)  BMI 24.97 kg/m2  SpO2 97%  General:   Well developed, well nourished . NAD.  Neck:  Decreased range of motion. Supple. No  thyromegaly , normal carotid pulse HEENT:  Normocephalic . Face symmetric, atraumatic Lungs:  CTA B Normal respiratory effort, no intercostal retractions, no accessory muscle use. Heart: RRR,  no murmur.  Abdomen:  Not distended, soft, non-tender. No rebound or rigidity. No mass,organomegaly Rectal:  External abnormalities: none. Normal sphincter tone. No rectal masses or tenderness.  No stools found Prostate: Prostate gland firm and smooth, no enlargement, nodularity, tenderness, mass, asymmetry or induration.  Muscle skeletal: no pretibial edema bilaterally  Skin: Exposed areas without rash. Not pale. Not jaundice Neurologic:  alert & oriented X3.  Speech normal, gait appropriate for age and unassisted Strength symmetric and appropriate for age.  Psych: Cognition and judgment appear intact.  Cooperative with normal attention span and concentration.  Behavior appropriate. No anxious or depressed appearing.       Assessment & Plan:

## 2014-05-27 NOTE — Progress Notes (Signed)
Pre visit review using our clinic review tool, if applicable. No additional management support is needed unless otherwise documented below in the visit note. 

## 2014-05-27 NOTE — Patient Instructions (Signed)
Get your blood work before you leave    Come back to the office in 1 year  for a physical exam  Please schedule an appointment at the front desk    Come back fasting       Fall Prevention and Home Safety Falls cause injuries and can affect all age groups. It is possible to use preventive measures to significantly decrease the likelihood of falls. There are many simple measures which can make your home safer and prevent falls. OUTDOORS  Repair cracks and edges of walkways and driveways.  Remove high doorway thresholds.  Trim shrubbery on the main path into your home.  Have good outside lighting.  Clear walkways of tools, rocks, debris, and clutter.  Check that handrails are not broken and are securely fastened. Both sides of steps should have handrails.  Have leaves, snow, and ice cleared regularly.  Use sand or salt on walkways during winter months.  In the garage, clean up grease or oil spills. BATHROOM  Install night lights.  Install grab bars by the toilet and in the tub and shower.  Use non-skid mats or decals in the tub or shower.  Place a plastic non-slip stool in the shower to sit on, if needed.  Keep floors dry and clean up all water on the floor immediately.  Remove soap buildup in the tub or shower on a regular basis.  Secure bath mats with non-slip, double-sided rug tape.  Remove throw rugs and tripping hazards from the floors. BEDROOMS  Install night lights.  Make sure a bedside light is easy to reach.  Do not use oversized bedding.  Keep a telephone by your bedside.  Have a firm chair with side arms to use for getting dressed.  Remove throw rugs and tripping hazards from the floor. KITCHEN  Keep handles on pots and pans turned toward the center of the stove. Use back burners when possible.  Clean up spills quickly and allow time for drying.  Avoid walking on wet floors.  Avoid hot utensils and knives.  Position shelves so they are not  too high or low.  Place commonly used objects within easy reach.  If necessary, use a sturdy step stool with a grab bar when reaching.  Keep electrical cables out of the way.  Do not use floor polish or wax that makes floors slippery. If you must use wax, use non-skid floor wax.  Remove throw rugs and tripping hazards from the floor. STAIRWAYS  Never leave objects on stairs.  Place handrails on both sides of stairways and use them. Fix any loose handrails. Make sure handrails on both sides of the stairways are as long as the stairs.  Check carpeting to make sure it is firmly attached along stairs. Make repairs to worn or loose carpet promptly.  Avoid placing throw rugs at the top or bottom of stairways, or properly secure the rug with carpet tape to prevent slippage. Get rid of throw rugs, if possible.  Have an electrician put in a light switch at the top and bottom of the stairs. OTHER FALL PREVENTION TIPS  Wear low-heel or rubber-soled shoes that are supportive and fit well. Wear closed toe shoes.  When using a stepladder, make sure it is fully opened and both spreaders are firmly locked. Do not climb a closed stepladder.  Add color or contrast paint or tape to grab bars and handrails in your home. Place contrasting color strips on first and last steps.  Learn and  use mobility aids as needed. Install an electrical emergency response system.  Turn on lights to avoid dark areas. Replace light bulbs that burn out immediately. Get light switches that glow.  Arrange furniture to create clear pathways. Keep furniture in the same place.  Firmly attach carpet with non-skid or double-sided tape.  Eliminate uneven floor surfaces.  Select a carpet pattern that does not visually hide the edge of steps.  Be aware of all pets. OTHER HOME SAFETY TIPS  Set the water temperature for 120 F (48.8 C).  Keep emergency numbers on or near the telephone.  Keep smoke detectors on every  level of the home and near sleeping areas. Document Released: 01/13/2002 Document Revised: 07/25/2011 Document Reviewed: 04/14/2011 [facility] Patient Information 2015 [place], [place]. This information is not intended to replace advice given to you by your health care provider. Make sure you discuss any questions you have with your health care provider.   Preventive Care for Adults Ages 14 and over  Blood pressure check.** / Every 1 to 2 years.  Lipid and cholesterol check.**/ Every 5 years beginning at age 53.  Lung cancer screening. / Every year if you are aged 4-80 years and have a 30-pack-year history of smoking and currently smoke or have quit within the past 15 years. Yearly screening is stopped once you have quit smoking for at least 15 years or develop a health problem that would prevent you from having lung cancer treatment.  Fecal occult blood test (FOBT) of stool. / Every year beginning at age 37 and continuing until age 61. You may not have to do this test if you get a colonoscopy every 10 years.  Flexible sigmoidoscopy** or colonoscopy.** / Every 5 years for a flexible sigmoidoscopy or every 10 years for a colonoscopy beginning at age 39 and continuing until age 56.  Hepatitis C blood test.** / For all people born from [birth year] through 1963 and any individual with known risks for hepatitis C.  Abdominal aortic aneurysm (AAA) screening.** / A one-time screening for ages 13 to 28 years who are current or former smokers.  Skin self-exam. / Monthly.  Influenza vaccine. / Every year.  Tetanus, diphtheria, and acellular pertussis (Tdap/Td) vaccine.** / 1 dose of Td every 10 years.  Varicella vaccine.** / Consult your health care provider.  Zoster vaccine.** / 1 dose for adults aged 49 years or older.  Pneumococcal 13-valent conjugate (PCV13) vaccine.** / Consult your health care provider.  Pneumococcal polysaccharide (PPSV23) vaccine.** / 1 dose for all adults aged 20 years and  older.  Meningococcal vaccine.** / Consult your health care provider.  Hepatitis A vaccine.** / Consult your health care provider.  Hepatitis B vaccine.** / Consult your health care provider.  Haemophilus influenzae type b (Hib) vaccine.** / Consult your health care provider. **Family history and personal history of risk and conditions may change your health care provider's recommendations. Document Released: 03/21/2001 Document Revised: 01/28/2013 Document Reviewed: 06/20/2010 [facility] Patient Information 2015 [place], [place]. This information is not intended to replace advice given to you by your health care provider. Make sure you discuss any questions you have with your health care provider.

## 2014-05-28 ENCOUNTER — Other Ambulatory Visit (INDEPENDENT_AMBULATORY_CARE_PROVIDER_SITE_OTHER): Payer: Medicare Other

## 2014-05-28 DIAGNOSIS — D509 Iron deficiency anemia, unspecified: Secondary | ICD-10-CM

## 2014-05-28 LAB — FERRITIN: Ferritin: 9.3 ng/mL — ABNORMAL LOW (ref 22.0–322.0)

## 2014-05-28 LAB — IRON: Iron: 38 ug/dL — ABNORMAL LOW (ref 42–165)

## 2014-05-29 ENCOUNTER — Encounter: Payer: Self-pay | Admitting: Physician Assistant

## 2014-05-29 NOTE — Addendum Note (Signed)
Addended by: Wilfrid Lund on: 05/29/2014 01:27 PM   Modules accepted: Orders

## 2014-06-17 ENCOUNTER — Encounter: Payer: Self-pay | Admitting: *Deleted

## 2014-06-18 ENCOUNTER — Ambulatory Visit (INDEPENDENT_AMBULATORY_CARE_PROVIDER_SITE_OTHER): Payer: Medicare Other | Admitting: Physician Assistant

## 2014-06-18 ENCOUNTER — Encounter: Payer: Self-pay | Admitting: Physician Assistant

## 2014-06-18 VITALS — BP 120/76 | HR 69 | Ht 72.0 in | Wt 185.0 lb

## 2014-06-18 DIAGNOSIS — D509 Iron deficiency anemia, unspecified: Secondary | ICD-10-CM

## 2014-06-18 DIAGNOSIS — Z86010 Personal history of colonic polyps: Secondary | ICD-10-CM | POA: Diagnosis not present

## 2014-06-18 DIAGNOSIS — Z860101 Personal history of adenomatous and serrated colon polyps: Secondary | ICD-10-CM

## 2014-06-18 HISTORY — DX: Personal history of adenomatous and serrated colon polyps: Z86.0101

## 2014-06-18 HISTORY — DX: Iron deficiency anemia, unspecified: D50.9

## 2014-06-18 MED ORDER — NA SULFATE-K SULFATE-MG SULF 17.5-3.13-1.6 GM/177ML PO SOLN: 1.0000 | Freq: Once | ORAL | Status: DC

## 2014-06-18 MED ORDER — POLYSACCHARIDE IRON COMPLEX 150 MG PO CAPS: 150.0000 mg | ORAL_CAPSULE | Freq: Two times a day (BID) | ORAL | Status: DC

## 2014-06-18 NOTE — Progress Notes (Signed)
Reviewed and agree with management plan.  Elimelech Houseman T. Enio Hornback, MD FACG 
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
## 2014-06-18 NOTE — Patient Instructions (Addendum)
Please go to the basement level to our lab for the stool test kit. You will have to return it within 2 weeks .  We sent a prescription to [facility] for NuIron tablets.   .You have been scheduled for an endoscopy and colonoscopy. Please follow the written instructions given to you at your visit today. We have given you a Suprep sample for the colonoscopy prep. If you use inhalers (even only as needed), please bring them with you on the day of your procedure. Your physician has requested that you go to www.startemmi.com and enter the access code given to you at your visit today. This web site gives a general overview about your procedure. However, you should still follow specific instructions given to you by our office regarding your preparation for the procedure.

## 2014-06-19 ENCOUNTER — Other Ambulatory Visit: Payer: Medicare Other

## 2014-06-22 ENCOUNTER — Other Ambulatory Visit: Payer: Self-pay

## 2014-06-22 ENCOUNTER — Encounter: Payer: Medicare Other | Admitting: Gastroenterology

## 2014-06-22 ENCOUNTER — Other Ambulatory Visit (INDEPENDENT_AMBULATORY_CARE_PROVIDER_SITE_OTHER): Payer: Medicare Other

## 2014-06-22 DIAGNOSIS — D509 Iron deficiency anemia, unspecified: Secondary | ICD-10-CM | POA: Diagnosis not present

## 2014-06-22 LAB — FECAL OCCULT BLOOD, IMMUNOCHEMICAL: Fecal Occult Bld: NEGATIVE

## 2014-06-23 ENCOUNTER — Other Ambulatory Visit: Payer: Self-pay | Admitting: Gastroenterology

## 2014-06-23 ENCOUNTER — Ambulatory Visit (AMBULATORY_SURGERY_CENTER): Payer: Medicare Other | Admitting: Gastroenterology

## 2014-06-23 ENCOUNTER — Encounter: Payer: Self-pay | Admitting: Gastroenterology

## 2014-06-23 VITALS — BP 113/56 | HR 76 | Temp 97.9°F | Resp 23 | Ht 72.0 in | Wt 185.0 lb

## 2014-06-23 DIAGNOSIS — D122 Benign neoplasm of ascending colon: Secondary | ICD-10-CM | POA: Diagnosis not present

## 2014-06-23 DIAGNOSIS — K2900 Acute gastritis without bleeding: Secondary | ICD-10-CM | POA: Diagnosis not present

## 2014-06-23 DIAGNOSIS — D124 Benign neoplasm of descending colon: Secondary | ICD-10-CM | POA: Diagnosis not present

## 2014-06-23 DIAGNOSIS — K2270 Barrett's esophagus without dysplasia: Secondary | ICD-10-CM | POA: Diagnosis not present

## 2014-06-23 DIAGNOSIS — D509 Iron deficiency anemia, unspecified: Secondary | ICD-10-CM

## 2014-06-23 DIAGNOSIS — Z86010 Personal history of colonic polyps: Secondary | ICD-10-CM | POA: Diagnosis not present

## 2014-06-23 DIAGNOSIS — I10 Essential (primary) hypertension: Secondary | ICD-10-CM | POA: Diagnosis not present

## 2014-06-23 DIAGNOSIS — K2960 Other gastritis without bleeding: Secondary | ICD-10-CM

## 2014-06-23 DIAGNOSIS — K2950 Unspecified chronic gastritis without bleeding: Secondary | ICD-10-CM | POA: Diagnosis not present

## 2014-06-23 MED ORDER — OMEPRAZOLE 40 MG PO CPDR: 40.0000 mg | DELAYED_RELEASE_CAPSULE | Freq: Every morning | ORAL | Status: DC

## 2014-06-23 MED ORDER — SODIUM CHLORIDE 0.9 % IV SOLN: 500.0000 mL | INTRAVENOUS | Status: DC

## 2014-06-23 NOTE — Progress Notes (Signed)
Report to PACU, RN, vss, BBS= Clear.  

## 2014-06-23 NOTE — Progress Notes (Signed)
Called to room to assist during endoscopic procedure.  Patient ID and intended procedure confirmed with present staff. Received instructions for my participation in the procedure from the performing physician.  

## 2014-06-23 NOTE — Op Note (Addendum)
[place]  [address] [place], [address]   ENDOSCOPY PROCEDURE REPORT  PATIENT: Chase Hernandez, Chase Hernandez  MR#: [id number] BIRTHDATE: Feb 01, 1934 , 81  yrs. old GENDER: male ENDOSCOPIST: Ladene Artist, MD, [facility] PROCEDURE DATE:  06/23/2014 PROCEDURE:  EGD w/ biopsy ASA CLASS:     Class II INDICATIONS:  iron deficiency anemia. MEDICATIONS: Monitored anesthesia care, Residual sedation present, and Propofol 100 mg IV TOPICAL ANESTHETIC: none DESCRIPTION OF PROCEDURE: After the risks benefits and alternatives of the procedure were thoroughly explained, informed consent was obtained.  The LB [id number] endoscope was introduced through the mouth and advanced to the second portion of the duodenum , Without limitations.  The instrument was slowly withdrawn as the mucosa was fully examined.    ESOPHAGUS: There was LA Class B esophagitis (One or more mucosal breaks > 48mm, but without continuity across mucosal folds) noted. There was a benign appearing stricture at the gastroesophageal junction. The stricture was easily traversable.  Multiple biopsies were performed. STOMACH: Moderate erosive gastritis was found in the gastric antrum. Multiple biopsies were performed.  The stomach otherwise appeared normal. DUODENUM: The duodenal mucosa showed no abnormalities in the bulb and 2nd part of the duodenum.  Retroflexed views revealed a small hiatal hernia.  The scope was then withdrawn from the patient and the procedure completed. COMPLICATIONS: There were no immediate complications.  ENDOSCOPIC IMPRESSION: 1.   LA Class B esophagitis 2.   Stricture at the gastroesophageal junction; multiple biopsies performed 3.   Erosive gastritis in the gastric antrum; multiple biopsies performed 4.   Small hiatal hernia  RECOMMENDATIONS: 1.  Anti-reflux regimen 2.  Await pathology results 3.  PPI qam long term: omeprazole 40 mg po qam, 1 year of refills 4.  ASA 81 mg daily  is ok however you should discontinue all other ASA/NSAID products 5.  Follow up with PCP in 4-6 weeks 6.  GI follow up in 4 weeks  eSigned:  Ladene Artist, MD, [facility] 06/23/2014 3:59 PM Revised: 06/23/2014 3:59 PM

## 2014-06-23 NOTE — Op Note (Signed)
[place]  [address] [place], [address]   COLONOSCOPY PROCEDURE REPORT  PATIENT: Chase Hernandez, Chase Hernandez  MR#: [id number] BIRTHDATE: Oct 08, 1933 , 81  yrs. old GENDER: male ENDOSCOPIST: Ladene Artist, MD, [facility] PROCEDURE DATE:  06/23/2014 PROCEDURE:   Colonoscopy, diagnostic and Colonoscopy with snare polypectomy First Screening Colonoscopy - Avg.  risk and is 50 yrs.  old or older - No.  Prior Negative Screening - Now for repeat screening. N/A  History of Adenoma - Now for follow-up colonoscopy & has been > or = to 3 yrs.  N/A  Polyps removed today? Yes ASA CLASS:   Class II INDICATIONS:Unexplained iron deficiency anemia and PH Colon Adenoma.  MEDICATIONS: Monitored anesthesia care and Propofol 250 mg IV DESCRIPTION OF PROCEDURE:   After the risks benefits and alternatives of the procedure were thoroughly explained, informed consent was obtained.  The digital rectal exam revealed no abnormalities of the rectum.   The LB [id number]  endoscope was introduced through the anus and advanced to the cecum, which was identified by both the appendix and ileocecal valve. No adverse events experienced.   The quality of the prep was excellent. (Suprep was used)  The instrument was then slowly withdrawn as the colon was fully examined. Estimated blood loss is zero unless otherwise noted in this procedure report.  COLON FINDINGS: A sessile polyp measuring 6 mm in size was found in the ascending colon.  A polypectomy was performed with a cold snare.  The resection was complete, the polyp tissue was completely retrieved and sent to histology.   A sessile polyp measuring 9 mm in size was found in the descending colon.  A polypectomy was performed using snare cautery.  The resection was complete, the polyp tissue was completely retrieved and sent to histology. There was mild diverticulosis noted in the sigmoid colon. Melanosis coli was found throughout the entire examined  colon. The examination was otherwise normal.  Retroflexed views revealed internal Grade I hemorrhoids. The time to cecum = 5.0 Withdrawal time = 11.2   The scope was withdrawn and the procedure completed. COMPLICATIONS: There were no immediate complications. ENDOSCOPIC IMPRESSION: 1.   Sessile polyp in the ascending colon; polypectomy performed with a cold snare 2.   Sessile polyp in the descending colon; polypectomy performed using snare cautery 3.   Mild diverticulosis in the sigmoid colon 4.   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   Grade l internal hemorrhoids RECOMMENDATIONS: 1.  Hold Aspirin and all other NSAIDS for 2 weeks. 2.  Await pathology results 3.  High fiber diet with liberal fluid intake. 4.  Given your age, you will not need another colonoscopy for colon cancer screening or polyp surveillance.  These types of tests usually stop around the age 27.  eSigned:  Ladene Artist, MD, [facility] 06/23/2014 1:54 PM

## 2014-06-23 NOTE — Progress Notes (Signed)
No problems noted in the recovery room. maw 

## 2014-06-23 NOTE — Patient Instructions (Addendum)
YOU HAD AN ENDOSCOPIC PROCEDURE TODAY AT THE [facility]:   Refer to the procedure report that was given to you for any specific questions about what was found during the examination.  If the procedure report does not answer your questions, please call your gastroenterologist to clarify.  If you requested that your care partner not be given the details of your procedure findings, then the procedure report has been included in a sealed envelope for you to review at your convenience later.  YOU SHOULD EXPECT: Some feelings of bloating in the abdomen. Passage of more gas than usual.  Walking can help get rid of the air that was put into your GI tract during the procedure and reduce the bloating. If you had a lower endoscopy (such as a colonoscopy or flexible sigmoidoscopy) you may notice spotting of blood in your stool or on the toilet paper. If you underwent a bowel prep for your procedure, you may not have a normal bowel movement for a few days.  Please Note:  You might notice some irritation and congestion in your nose or some drainage.  This is from the oxygen used during your procedure.  There is no need for concern and it should clear up in a day or so.  SYMPTOMS TO REPORT IMMEDIATELY:   Following lower endoscopy (colonoscopy or flexible sigmoidoscopy):  Excessive amounts of blood in the stool  Significant tenderness or worsening of abdominal pains  Swelling of the abdomen that is new, acute  Fever of 100F or higher   Following upper endoscopy (EGD)  Vomiting of blood or coffee ground material  New chest pain or pain under the shoulder blades  Painful or persistently difficult swallowing  New shortness of breath  Fever of 100F or higher  Black, tarry-looking stools  For urgent or emergent issues, a gastroenterologist can be reached at any hour by calling [phone].   DIET: Your first meal following the procedure should be a small meal and then it is ok to progress to  your normal diet. Heavy or fried foods are harder to digest and may make you feel nauseous or bloated.  Likewise, meals heavy in dairy and vegetables can increase bloating.  Drink plenty of fluids but you should avoid alcoholic beverages for 24 hours.  ACTIVITY:  You should plan to take it easy for the rest of today and you should NOT DRIVE or use heavy machinery until tomorrow (because of the sedation medicines used during the test).    FOLLOW UP: Our staff will call the number listed on your records the next business day following your procedure to check on you and address any questions or concerns that you may have regarding the information given to you following your procedure. If we do not reach you, we will leave a message.  However, if you are feeling well and you are not experiencing any problems, there is no need to return our call.  We will assume that you have returned to your regular daily activities without incident.  If any biopsies were taken you will be contacted by phone or by letter within the next 1-3 weeks.  Please call us at [phone] if you have not heard about the biopsies in 3 weeks.    SIGNATURES/CONFIDENTIALITY: You and/or your care partner have signed paperwork which will be entered into your electronic medical record.  These signatures attest to the fact that that the information above on your After Visit Summary has been reviewed   and is understood.  Full responsibility of the confidentiality of this discharge information lies with you and/or your care-partner.     Handouts were given to your care partner on polyps, diverticulosis, a high fiber diet with liberal fluid intake, hemorrhoids, gastritis and GERD with hiatal hernia. You may resume your current medications today except hold aspirin and NSAIDS for 2 weeks. Per Dr. Fuller Plan you may continue aspirin 81 mg daily, but discontinue aspirin 325 mg at bed time. Tylenol is okay to take.  [facility] [place] [place] Await biopsy results. Follow up with your primary care MD in 4 - 6 weeks. Follow up with Dr. Fuller Plan in 4 weeks in the office. Please call if any questions or concerns.

## 2014-06-24 ENCOUNTER — Telehealth: Payer: Self-pay

## 2014-06-24 NOTE — Telephone Encounter (Signed)
No answer, left voicemail message.

## 2014-07-01 ENCOUNTER — Encounter: Payer: Self-pay | Admitting: Gastroenterology

## 2014-07-22 ENCOUNTER — Encounter: Payer: Self-pay | Admitting: Gastroenterology

## 2014-07-22 ENCOUNTER — Ambulatory Visit (INDEPENDENT_AMBULATORY_CARE_PROVIDER_SITE_OTHER): Payer: Medicare Other | Admitting: Gastroenterology

## 2014-07-22 VITALS — BP 120/70 | HR 72 | Ht 72.0 in | Wt 186.4 lb

## 2014-07-22 DIAGNOSIS — D509 Iron deficiency anemia, unspecified: Secondary | ICD-10-CM

## 2014-07-22 DIAGNOSIS — K2100 Gastro-esophageal reflux disease with esophagitis, without bleeding: Secondary | ICD-10-CM

## 2014-07-22 DIAGNOSIS — Z86010 Personal history of colonic polyps: Secondary | ICD-10-CM | POA: Diagnosis not present

## 2014-07-22 NOTE — Patient Instructions (Addendum)
Your physician has requested that you go to the basement for the following lab work in one month around 08-21-14: CBC  Please follow up in 1 year

## 2014-07-22 NOTE — Progress Notes (Signed)
    History of Present Illness: This is a 81 year old white male returning for follow-up of iron deficiency anemia. He is accompanied by his wife. EGD in May showed LA class B esophagitis, erosive gastritis, esophageal stricture, HH. Esophageal stricture biopsy showed intestinal metaplasia, Barrett's. Colonoscopy in May showed 2 small adenomatous colon polyps, diverticulosis, melanosis coli and internal hemorrhoids. He noted 1 episode of dyspeptic symptoms following an evening dose of iron however this has resolved in 1 day. He has no gastrointestinal complaints.  Current Medications, Allergies, Past Medical History, Past Surgical History, Family History and Social History were reviewed in [employer] record.  Physical Exam: General: Well developed , well nourished, no acute distress Head: Normocephalic and atraumatic Eyes:  sclerae anicteric, EOMI Ears: Normal auditory acuity Mouth: No deformity or lesions Lungs: Clear throughout to auscultation Heart: Regular rate and rhythm; no murmurs, rubs or bruits Abdomen: Soft, non tender and non distended. No masses, hepatosplenomegaly or hernias noted. Normal Bowel sounds Musculoskeletal: Symmetrical with no gross deformities  Pulses:  Normal pulses noted Extremities: No clubbing, cyanosis, edema or deformities noted Neurological: Alert oriented x 4, grossly nonfocal Psychological:  Alert and cooperative. Normal mood and affect  Assessment and Recommendations:  1. Iron deficiency anemia. Likely secondary to erosive gastritis and erosive esophagitis. Continue iron replacement and follow-up CBC in 1 month.   2. LA class B erosive esophagitis, erosive gastritis, esophageal stricture with biopsy showing intestinal metaplasia. Continue standard antireflux measures and omeprazole 40 mg daily for 3 months then 20 mg daily long-term. No plans for future surveillance endoscopy given age.  3. Personal history of adenomatous colon  polyps. No plans for future surveillance colonoscopies given age.  15 minutes of face-to-face time spent with the patient. Greater than 50% of the time spent counseling and coordinating care.

## 2014-08-20 ENCOUNTER — Other Ambulatory Visit (INDEPENDENT_AMBULATORY_CARE_PROVIDER_SITE_OTHER): Payer: Medicare Other

## 2014-08-20 DIAGNOSIS — D509 Iron deficiency anemia, unspecified: Secondary | ICD-10-CM | POA: Diagnosis not present

## 2014-08-20 DIAGNOSIS — Z86010 Personal history of colonic polyps: Secondary | ICD-10-CM | POA: Diagnosis not present

## 2014-08-20 DIAGNOSIS — K2100 Gastro-esophageal reflux disease with esophagitis, without bleeding: Secondary | ICD-10-CM

## 2014-08-20 LAB — CBC WITH DIFFERENTIAL/PLATELET
Basophils Absolute: 0 10*3/uL (ref 0.0–0.1)
Basophils Relative: 0.4 % (ref 0.0–3.0)
EOS ABS: 0.1 10*3/uL (ref 0.0–0.7)
Eosinophils Relative: 1.9 % (ref 0.0–5.0)
HCT: 44 % (ref 39.0–52.0)
HEMOGLOBIN: 14.6 g/dL (ref 13.0–17.0)
Lymphocytes Relative: 18.4 % (ref 12.0–46.0)
Lymphs Abs: 1.3 10*3/uL (ref 0.7–4.0)
MCHC: 33.3 g/dL (ref 30.0–36.0)
MCV: 90 fl (ref 78.0–100.0)
MONOS PCT: 12.5 % — AB (ref 3.0–12.0)
Monocytes Absolute: 0.9 10*3/uL (ref 0.1–1.0)
NEUTROS PCT: 66.8 % (ref 43.0–77.0)
Neutro Abs: 4.6 10*3/uL (ref 1.4–7.7)
PLATELETS: 271 10*3/uL (ref 150.0–400.0)
RBC: 4.88 Mil/uL (ref 4.22–5.81)
RDW: 16.3 % — ABNORMAL HIGH (ref 11.5–15.5)
WBC: 7 10*3/uL (ref 4.0–10.5)

## 2014-09-10 ENCOUNTER — Telehealth: Payer: Self-pay | Admitting: *Deleted

## 2014-09-10 NOTE — Telephone Encounter (Signed)
Pt dropped of disability parking placard. Filled out as much as possible and forwarded to Dr. Larose Kells. JG//CMA

## 2014-09-15 NOTE — Telephone Encounter (Signed)
Mailed to pt's home address. Copy sent for scanning. JG//CMA

## 2015-05-14 ENCOUNTER — Other Ambulatory Visit: Payer: Self-pay | Admitting: Internal Medicine

## 2015-06-11 ENCOUNTER — Other Ambulatory Visit: Payer: Self-pay | Admitting: Internal Medicine

## 2015-06-11 NOTE — Telephone Encounter (Signed)
Rx sent to the pharmacy by e-script.//AB/CMA 

## 2015-06-12 ENCOUNTER — Other Ambulatory Visit: Payer: Self-pay | Admitting: Gastroenterology

## 2015-06-21 ENCOUNTER — Telehealth: Payer: Self-pay | Admitting: *Deleted

## 2015-06-21 ENCOUNTER — Encounter: Payer: Self-pay | Admitting: *Deleted

## 2015-06-21 NOTE — Telephone Encounter (Signed)
Pre-Visit Call completed with patient and chart updated.   Pre-Visit Info documented in Specialty Comments under [employer].    

## 2015-06-22 ENCOUNTER — Ambulatory Visit (INDEPENDENT_AMBULATORY_CARE_PROVIDER_SITE_OTHER): Payer: Medicare Other | Admitting: Internal Medicine

## 2015-06-22 ENCOUNTER — Encounter: Payer: Self-pay | Admitting: Internal Medicine

## 2015-06-22 VITALS — BP 122/78 | HR 79 | Temp 97.9°F | Ht 72.0 in | Wt 186.1 lb

## 2015-06-22 DIAGNOSIS — I10 Essential (primary) hypertension: Secondary | ICD-10-CM | POA: Diagnosis not present

## 2015-06-22 DIAGNOSIS — E785 Hyperlipidemia, unspecified: Secondary | ICD-10-CM

## 2015-06-22 DIAGNOSIS — Z0000 Encounter for general adult medical examination without abnormal findings: Secondary | ICD-10-CM | POA: Diagnosis not present

## 2015-06-22 DIAGNOSIS — Z09 Encounter for follow-up examination after completed treatment for conditions other than malignant neoplasm: Secondary | ICD-10-CM

## 2015-06-22 DIAGNOSIS — D500 Iron deficiency anemia secondary to blood loss (chronic): Secondary | ICD-10-CM | POA: Diagnosis not present

## 2015-06-22 NOTE — Assessment & Plan Note (Addendum)
Td --2014 ;pneumonia shot--2007; prevnar-- 2013 ; had a  Zostavax 2010  DEXA 11-2006 normal  Cscope 08-2007 had a tubular adenoma  [facility]--2014--Dr Stark--adenomatous polyps  Coloscopy 06/2014, polyps, no further screening colonoscopy Prostate cancer screening: Last PSA and DRE normal 2016, + FH prostate cancer, reassess the issue next year  Doing great w/ diet-exercise Labs: BMP, FLP, CBC, TSH.

## 2015-06-22 NOTE — Progress Notes (Signed)
Subjective:    Patient ID: Chase Hernandez, male    DOB: Jun 12, 1933, 82 y.o.   MRN: [id number]  DOS:  06/22/2015 Type of visit - description :  Interval history: Here for Medicare AWV:   1. Risk factors based on Past M, S, F history: reviewed   2. Physical Activities: very active, martial arts, weights at home x3-4  times a week 3. Depression/mood: (-) screening   4. Hearing: hearing aids since 2015 6. Fall Risk: no recent falls, see instructions   7. Home Safety: does feel safe at home   8. Height, weight, &visual acuity: see VS, s/p cataract surgery, vision is great , sees eye doctor, s/o cataracts surgery 9. Counseling: yes   10. Labs ordered based on risk factors: yes   11. Referral Coordination, if needed   12. are Plan, see a/p   13. Cognitive Assessment: cognition, motor skills and memory seem normal   14. Care team updated   15. End of life care , already has a health care power of attorney  in addition, we discussed the following   HTN: Good compliance of medication, ambulatory BPs excellent in the low 110s/80. Iron deficiency anemia: Saw GI, workup reviewed. Doing well. Not taking ASA as he was told he has gastritis and is somewhat concerned about NSAIDs DJD: Not an issue at this time, no major problems with neck or any other pains.   Review of Systems  Constitutional: No fever. No chills. No unexplained wt changes. No unusual sweats  HEENT: No dental problems, no ear discharge, no facial swelling, no voice changes. No eye discharge, no eye  redness , no  intolerance to light   Respiratory: No wheezing , no  difficulty breathing. No cough , no mucus production  Cardiovascular: No CP, no leg swelling , no  Palpitations  GI: no nausea, no vomiting, no diarrhea , no  abdominal pain.  No blood in the stools. No dysphagia, no odynophagia    Endocrine: No polyphagia, no polyuria , no polydipsia  GU: No dysuria, gross hematuria, difficulty urinating. No urinary  urgency, no frequency.  Musculoskeletal: No joint swellings or unusual aches or pains  Skin: No change in the color of the skin, palor , no  Rash  Allergic, immunologic: No environmental allergies , no  food allergies  Neurological: No dizziness no  syncope. No headaches. No diplopia, no slurred, no slurred speech, no motor deficits, no facial  Numbness  Hematological: No enlarged lymph nodes, no easy bruising , no unusual bleedings  Psychiatry: No suicidal ideas, no hallucinations, no beavior problems, no confusion.  No unusual/severe anxiety, no depression  Past Medical History  Diagnosis Date  . Hypertension   . Hyperlipidemia   . LBBB (left bundle branch block)   . Osteoarthritis     severe hip-back pain, saw neurology, nerve conduction study 2/11 negative; pain was due to hip arthritis  . Neck fracture [facility]) 04-2010    Dr Ronnald Ramp, conservative treatnment  . Kidney stones 2012    h/o  . Colon polyps 08/13/2012    tubular adenomas  . Barrett's esophagus   . Anemia     Past Surgical History  Procedure Laterality Date  . Tonsillectomy    . Total hip arthroplasty      right, surgery April 5,2011 @ [facility]  . Total hip arthroplasty  10/2009    left  . Cataract extraction w/ intraocular lens  implant, bilateral  2013    Social  History   Social History  . Marital Status: Married    Spouse Name: N/A  . Number of Children: 1  . Years of Education: N/A   Occupational History  . 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     Social History Main Topics  . Smoking status: Never Smoker   . Smokeless tobacco: Never Used  . Alcohol Use: No  . Drug Use: No  . Sexual Activity: Not on file   Other Topics Concern  . 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 Breast cancer Mother   . Diabetes      GM  . Heart attack Neg Hx   . Prostate cancer Father 38  . Colon cancer Neg Hx        Medication List       This list is accurate as of: 06/22/15 11:59 PM.  Always use your most recent med list.               aspirin 81 MG tablet  Take 81 mg by mouth daily.     atenolol-chlorthalidone 50-25 MG tablet  Commonly known as:  TENORETIC  Take 0.5 tablets by mouth daily.     GLUCOSAMINE HCL PO  Take by mouth daily.     KLOR-CON 10 10 MEQ tablet  Generic drug:  potassium chloride  TAKE 1 TABLET BY MOUTH EVERY DAY     omeprazole 20 MG capsule  Commonly known as:  PRILOSEC  Take 1 capsule (20 mg total) by mouth daily.     ZINC PO  Take by mouth.           Objective:   Physical Exam BP 122/78 mmHg  Pulse 79  Temp(Src) 97.9 F (36.6 C) (Oral)  Ht 6' (1.829 m)  Wt 186 lb 2 oz (84.426 kg)  BMI 25.24 kg/m2  SpO2 97%  General:   Well developed, well nourished . NAD.  Neck: No  thyromegaly  HEENT:  Normocephalic . Face symmetric, atraumatic Lungs:  CTA B Normal respiratory effort, no intercostal retractions, no accessory muscle use. Heart: RRR,  no murmur.  No pretibial edema bilaterally  Abdomen:  Not distended, soft, non-tender. No rebound or rigidity.  No bruit Skin: Exposed areas without rash. Not pale. Not jaundice Neurologic:  alert & oriented X3.  Speech normal, gait appropriate for age and unassisted Strength symmetric and appropriate for age.  Psych: Cognition and judgment appear intact.  Cooperative with normal attention span and concentration.  Behavior appropriate. No anxious or depressed appearing.    Assessment & Plan:   Assessment HTN Hyperlipidemia DJD: See surgeries Neck fracture 2012, Dr. Ronnald Ramp, conservative treatment H/o Kidney stones 2012 Barrett's esophagus H/o anemia: cscope , EGD done 06-2014 : colon polyps,gastritis,+ Barrett's--- f/u prn, no further  surveillance scope,  rx PPIs, iron  PLAN: HTN: Continue Tenoretic, potassium supplements, check a BMP Hyperlipidemia: Diet control, check labs DJD: Not requiring any pain medication at this point Iron deficiency anemia: Had a colonoscopy and EGD (06-2014) last year, found to have gastritis, Barrett's esophagus and colon polyps. S/p iron, asx at this point, GI does not plan to repeat endoscopies. Will check a CBC. Also, I believe  is okay  to go back on a baby aspirin (see pt's concerns), take it with food , benefits > risks RTC one year

## 2015-06-22 NOTE — Progress Notes (Signed)
Pre visit review using our clinic review tool, if applicable. No additional management support is needed unless otherwise documented below in the visit note. 

## 2015-06-22 NOTE — Patient Instructions (Signed)
Get your blood work before you leave    Next visit in one year  Okay to take a baby aspirin daily with food, stop if you have any side effects     Fall Prevention and Home Safety Falls cause injuries and can affect all age groups. It is possible to use preventive measures to significantly decrease the likelihood of falls. There are many simple measures which can make your home safer and prevent falls. OUTDOORS  Repair cracks and edges of walkways and driveways.  Remove high doorway thresholds.  Trim shrubbery on the main path into your home.  Have good outside lighting.  Clear walkways of tools, rocks, debris, and clutter.  Check that handrails are not broken and are securely fastened. Both sides of steps should have handrails.  Have leaves, snow, and ice cleared regularly.  Use sand or salt on walkways during winter months.  In the garage, clean up grease or oil spills. BATHROOM  Install night lights.  Install grab bars by the toilet and in the tub and shower.  Use non-skid mats or decals in the tub or shower.  Place a plastic non-slip stool in the shower to sit on, if needed.  Keep floors dry and clean up all water on the floor immediately.  Remove soap buildup in the tub or shower on a regular basis.  Secure bath mats with non-slip, double-sided rug tape.  Remove throw rugs and tripping hazards from the floors. BEDROOMS  Install night lights.  Make sure a bedside light is easy to reach.  Do not use oversized bedding.  Keep a telephone by your bedside.  Have a firm chair with side arms to use for getting dressed.  Remove throw rugs and tripping hazards from the floor. KITCHEN  Keep handles on pots and pans turned toward the center of the stove. Use back burners when possible.  Clean up spills quickly and allow time for drying.  Avoid walking on wet floors.  Avoid hot utensils and knives.  Position shelves so they are not too high or  low.  Place commonly used objects within easy reach.  If necessary, use a sturdy step stool with a grab bar when reaching.  Keep electrical cables out of the way.  Do not use floor polish or wax that makes floors slippery. If you must use wax, use non-skid floor wax.  Remove throw rugs and tripping hazards from the floor. STAIRWAYS  Never leave objects on stairs.  Place handrails on both sides of stairways and use them. Fix any loose handrails. Make sure handrails on both sides of the stairways are as long as the stairs.  Check carpeting to make sure it is firmly attached along stairs. Make repairs to worn or loose carpet promptly.  Avoid placing throw rugs at the top or bottom of stairways, or properly secure the rug with carpet tape to prevent slippage. Get rid of throw rugs, if possible.  Have an electrician put in a light switch at the top and bottom of the stairs. OTHER FALL PREVENTION TIPS  Wear low-heel or rubber-soled shoes that are supportive and fit well. Wear closed toe shoes.  When using a stepladder, make sure it is fully opened and both spreaders are firmly locked. Do not climb a closed stepladder.  Add color or contrast paint or tape to grab bars and handrails in your home. Place contrasting color strips on first and last steps.  Learn and use mobility aids as needed. Install an Dealer  emergency response system.  Turn on lights to avoid dark areas. Replace light bulbs that burn out immediately. Get light switches that glow.  Arrange furniture to create clear pathways. Keep furniture in the same place.  Firmly attach carpet with non-skid or double-sided tape.  Eliminate uneven floor surfaces.  Select a carpet pattern that does not visually hide the edge of steps.  Be aware of all pets. OTHER HOME SAFETY TIPS  Set the water temperature for 120 F (48.8 C).  Keep emergency numbers on or near the telephone.  Keep smoke detectors on every level of the  home and near sleeping areas. Document Released: 01/13/2002 Document Revised: 07/25/2011 Document Reviewed: 04/14/2011 [facility] Patient Information 2015 [place], [place]. This information is not intended to replace advice given to you by your health care provider. Make sure you discuss any questions you have with your health care provider.   Preventive Care for Adults Ages 55 and over  Blood pressure check.** / Every 1 to 2 years.  Lipid and cholesterol check.**/ Every 5 years beginning at age 70.  Lung cancer screening. / Every year if you are aged 50-80 years and have a 30-pack-year history of smoking and currently smoke or have quit within the past 15 years. Yearly screening is stopped once you have quit smoking for at least 15 years or develop a health problem that would prevent you from having lung cancer treatment.  Fecal occult blood test (FOBT) of stool. / Every year beginning at age 36 and continuing until age 28. You may not have to do this test if you get a colonoscopy every 10 years.  Flexible sigmoidoscopy** or colonoscopy.** / Every 5 years for a flexible sigmoidoscopy or every 10 years for a colonoscopy beginning at age 31 and continuing until age 41.  Hepatitis C blood test.** / For all people born from [birth year] through 1963 and any individual with known risks for hepatitis C.  Abdominal aortic aneurysm (AAA) screening.** / A one-time screening for ages 35 to 80 years who are current or former smokers.  Skin self-exam. / Monthly.  Influenza vaccine. / Every year.  Tetanus, diphtheria, and acellular pertussis (Tdap/Td) vaccine.** / 1 dose of Td every 10 years.  Varicella vaccine.** / Consult your health care provider.  Zoster vaccine.** / 1 dose for adults aged 46 years or older.  Pneumococcal 13-valent conjugate (PCV13) vaccine.** / Consult your health care provider.  Pneumococcal polysaccharide (PPSV23) vaccine.** / 1 dose for all adults aged 57 years and  older.  Meningococcal vaccine.** / Consult your health care provider.  Hepatitis A vaccine.** / Consult your health care provider.  Hepatitis B vaccine.** / Consult your health care provider.  Haemophilus influenzae type b (Hib) vaccine.** / Consult your health care provider. **Family history and personal history of risk and conditions may change your health care provider's recommendations. Document Released: 03/21/2001 Document Revised: 01/28/2013 Document Reviewed: 06/20/2010 [facility] Patient Information 2015 [place], [place]. This information is not intended to replace advice given to you by your health care provider. Make sure you discuss any questions you have with your health care provider.

## 2015-06-23 DIAGNOSIS — Z09 Encounter for follow-up examination after completed treatment for conditions other than malignant neoplasm: Secondary | ICD-10-CM | POA: Insufficient documentation

## 2015-06-23 LAB — LIPID PANEL
CHOL/HDL RATIO: 4
Cholesterol: 202 mg/dL — ABNORMAL HIGH (ref 0–200)
HDL: 55.3 mg/dL (ref >39.00)
LDL Cholesterol: 131 mg/dL — ABNORMAL HIGH (ref 0–99)
NonHDL: 146.53
TRIGLYCERIDES: 79 mg/dL (ref 0.0–149.0)
VLDL: 15.8 mg/dL (ref 0.0–40.0)

## 2015-06-23 LAB — BASIC METABOLIC PANEL
BUN: 14 mg/dL (ref 6–23)
CALCIUM: 9.9 mg/dL (ref 8.4–10.5)
CO2: 30 mEq/L (ref 19–32)
CREATININE: 0.95 mg/dL (ref 0.40–1.50)
Chloride: 96 mEq/L (ref 96–112)
GFR: 81.04 mL/min (ref >60.00)
GLUCOSE: 95 mg/dL (ref 70–99)
Potassium: 3.7 mEq/L (ref 3.5–5.1)
SODIUM: 134 meq/L — AB (ref 135–145)

## 2015-06-23 LAB — CBC WITH DIFFERENTIAL/PLATELET
BASOS ABS: 0.1 10*3/uL (ref 0.0–0.1)
Basophils Relative: 0.8 % (ref 0.0–3.0)
EOS ABS: 0.1 10*3/uL (ref 0.0–0.7)
Eosinophils Relative: 1.6 % (ref 0.0–5.0)
HCT: 45 % (ref 39.0–52.0)
Hemoglobin: 15.3 g/dL (ref 13.0–17.0)
LYMPHS ABS: 1.5 10*3/uL (ref 0.7–4.0)
Lymphocytes Relative: 17.6 % (ref 12.0–46.0)
MCHC: 34 g/dL (ref 30.0–36.0)
MCV: 96.9 fl (ref 78.0–100.0)
MONO ABS: 0.9 10*3/uL (ref 0.1–1.0)
Monocytes Relative: 10.2 % (ref 3.0–12.0)
NEUTROS ABS: 6.1 10*3/uL (ref 1.4–7.7)
NEUTROS PCT: 69.8 % (ref 43.0–77.0)
PLATELETS: 298 10*3/uL (ref 150.0–400.0)
RBC: 4.64 Mil/uL (ref 4.22–5.81)
RDW: 13.3 % (ref 11.5–15.5)
WBC: 8.8 10*3/uL (ref 4.0–10.5)

## 2015-06-23 LAB — TSH: TSH: 0.9 u[IU]/mL (ref 0.35–4.50)

## 2015-06-23 NOTE — Assessment & Plan Note (Signed)
HTN: Continue Tenoretic, potassium supplements, check a BMP Hyperlipidemia: Diet control, check labs DJD: Not requiring any pain medication at this point Iron deficiency anemia: Had a colonoscopy and EGD (06-2014) last year, found to have gastritis, Barrett's esophagus and colon polyps. S/p iron, asx at this point, GI does not plan to repeat endoscopies. Will check a CBC. Also, I believe  is okay  to go back on a baby aspirin (see pt's concerns), take it with food , benefits > risks RTC one year

## 2015-07-04 ENCOUNTER — Other Ambulatory Visit: Payer: Self-pay | Admitting: Internal Medicine

## 2015-07-08 ENCOUNTER — Other Ambulatory Visit: Payer: Self-pay | Admitting: Internal Medicine

## 2015-09-03 ENCOUNTER — Other Ambulatory Visit: Payer: Self-pay | Admitting: Gastroenterology

## 2015-09-30 ENCOUNTER — Other Ambulatory Visit: Payer: Self-pay | Admitting: Gastroenterology

## 2015-10-24 ENCOUNTER — Other Ambulatory Visit: Payer: Self-pay | Admitting: Gastroenterology

## 2015-12-25 DIAGNOSIS — Z23 Encounter for immunization: Secondary | ICD-10-CM | POA: Diagnosis not present

## 2016-01-03 DIAGNOSIS — Z96643 Presence of artificial hip joint, bilateral: Secondary | ICD-10-CM | POA: Diagnosis not present

## 2016-01-03 DIAGNOSIS — M25552 Pain in left hip: Secondary | ICD-10-CM | POA: Diagnosis not present

## 2016-01-03 DIAGNOSIS — M25551 Pain in right hip: Secondary | ICD-10-CM | POA: Diagnosis not present

## 2016-01-03 DIAGNOSIS — M87051 Idiopathic aseptic necrosis of right femur: Secondary | ICD-10-CM | POA: Diagnosis not present

## 2016-01-03 DIAGNOSIS — M87052 Idiopathic aseptic necrosis of left femur: Secondary | ICD-10-CM | POA: Diagnosis not present

## 2016-06-12 ENCOUNTER — Other Ambulatory Visit: Payer: Self-pay

## 2016-06-12 MED ORDER — POTASSIUM CHLORIDE ER 10 MEQ PO TBCR: 10.0000 meq | EXTENDED_RELEASE_TABLET | Freq: Every day | ORAL | 0 refills | Status: DC

## 2016-06-20 NOTE — Progress Notes (Signed)
Subjective:   Chase Hernandez is a 83 y.o. male who presents for Medicare Annual/Subsequent preventive examination.  Review of Systems:  No ROS.  Medicare Wellness Visit. Cardiac Risk Factors include: advanced age (>59men, >65 women);dyslipidemia;hypertension;male gender Sleep patterns: Sleeps about 9 hrs per night. Wakes occasionally to urinate. Easily goes back to sleep. Feels rested. Home Safety/Smoke Alarms:  Feels safe in home. Smoke alarms in place.  Living environment; residence and Firearm Safety: Lives with wife. No stairs. Guns safely stored. Seat Belt Safety/Bike Helmet: Wears seat belt.   Counseling:   Eye Exam- Hx cataract sx. Reading glasses. Dr.Bevis as needed. Dental- Dr.Newsome every 6 months.  Male:   CCS-  Last 06/23/14:  Mild diverticulosis. Pre-cancerous polyp removed. No routine screening recommended due to age. PSA-  Lab Results  Component Value Date   PSA 2.54 05/27/2014   PSA 2.28 04/18/2012   PSA 2.01 02/17/2010       Objective:    Vitals: BP 128/82 (BP Location: Right Arm, Patient Position: Sitting, Cuff Size: Normal)   Pulse 78   Ht 6' (1.829 m)   Wt 189 lb (85.7 kg)   SpO2 98%   BMI 25.63 kg/m   Body mass index is 25.63 kg/m.  Tobacco History  Smoking Status  . Never Smoker  Smokeless Tobacco  . Never Used     Counseling given: Not Answered   Past Medical History:  Diagnosis Date  . Anemia   . Barrett's esophagus   . Colon polyps 08/13/2012   tubular adenomas  . Hyperlipidemia   . Hypertension   . Kidney stones 2012   h/o  . LBBB (left bundle branch block)   . Neck fracture [facility]) 04-2010   Dr Ronnald Ramp, conservative treatnment  . Osteoarthritis    severe hip-back pain, saw neurology, nerve conduction study 2/11 negative; pain was due to hip arthritis   Past Surgical History:  Procedure Laterality Date  . CATARACT EXTRACTION W/ INTRAOCULAR LENS  IMPLANT, BILATERAL  2013  . TONSILLECTOMY    . TOTAL HIP ARTHROPLASTY     right, surgery April 5,2011 @ [place]  10/2009   left   Family History  Problem Relation Age of Onset  . Breast cancer Mother   . Diabetes Unknown        GM  . Prostate cancer Father 57  . Heart attack Neg Hx   . Colon cancer Neg Hx    History  Sexual Activity  . Sexual activity: No    Outpatient Encounter Prescriptions as of 06/23/2016  Medication Sig  . aspirin 81 MG tablet Take 81 mg by mouth daily.  Marland Kitchen atenolol-chlorthalidone (TENORETIC) 50-25 MG tablet Take 0.5 tablets by mouth daily.  Marland Kitchen GLUCOSAMINE HCL PO Take by mouth daily.  . Multiple Vitamins-Minerals (ZINC PO) Take by mouth.    Marland Kitchen omeprazole (PRILOSEC) 20 MG capsule TAKE 1 CAPSULE (20 MG TOTAL) BY MOUTH DAILY.  Marland Kitchen potassium chloride (KLOR-CON 10) 10 MEQ tablet Take 1 tablet (10 mEq total) by mouth daily.   No facility-administered encounter medications on file as of 06/23/2016.     Activities of Daily Living In your present state of health, do you have any difficulty performing the following activities: 06/23/2016  Hearing? Y  Vision? N  Difficulty concentrating or making decisions? N  Walking or climbing stairs? Y  Dressing or bathing? N  Doing errands, shopping? N  Preparing Food and eating ? N  Using the Toilet?  N  In the past six months, have you accidently leaked urine? N  Do you have problems with loss of bowel control? N  Managing your Medications? N  Managing your Finances? N  Housekeeping or managing your Housekeeping? N  Some recent data might be hidden    Patient Care Team: Colon Branch, MD as PCP - General Ladene Artist, MD as Consulting Physician (Gastroenterology)   Assessment:    Physical assessment deferred to PCP.  Exercise Activities and Dietary recommendations Current Exercise Habits: Home exercise routine, Type of exercise: strength training/weights, Time (Minutes): 60, Frequency (Times/Week): 4, Weekly Exercise (Minutes/Week): 240, Intensity: Mild    Diet (meal preparation, eat out, water intake, caffeinated beverages, dairy products, fruits and vegetables): in general, a "healthy" diet      Goals      Patient Stated   . Maintain current healthy lifestyle (pt-stated)      Fall Risk Fall Risk  06/23/2016 06/22/2015 05/27/2014 05/23/2013 04/15/2012  Falls in the past year? No No No No No   Depression Screen PHQ 2/9 Scores 06/23/2016 06/22/2015 05/27/2014 05/23/2013  PHQ - 2 Score 0 0 0 0    Cognitive Function Ad8 score reviewed for issues:  Issues making decisions:no  Less interest in hobbies / activities:no  Repeats questions, stories (family complaining):no  Trouble using ordinary gadgets (microwave, computer, phone):no  Forgets the month or year: no  Mismanaging finances: no  Remembering appts:no  Daily problems with thinking and/or memory:no Ad8 score is=0        Immunization History  Administered Date(s) Administered  . Influenza Whole 11/23/2006, 02/08/2009, 12/03/2011  . Influenza-Unspecified 12/26/2013, 12/08/2014  . Pneumococcal Conjugate-13 05/23/2013  . Pneumococcal Polysaccharide-23 12/07/2005  . Td 04/09/2002  . Tdap 05/09/2012  . Zoster 04/01/2008   Screening Tests Health Maintenance  Topic Date Due  . INFLUENZA VACCINE  09/06/2016  . TETANUS/TDAP  05/10/2022  . PNA vac Low Risk Adult  Completed      Plan:   Follow up with Dr.Paz today as scheduled today.  Continue to eat heart healthy diet (full of fruits, vegetables, whole grains, lean protein, water--limit salt, fat, and sugar intake) and increase physical activity as tolerated.  Continue doing brain stimulating activities (puzzles, reading, adult coloring books, staying active) to keep memory sharp.    I have personally reviewed and noted the following in the patient's chart:   . Medical and social history . Use of alcohol, tobacco or illicit drugs  . Current medications and supplements . Functional ability and  status . Nutritional status . Physical activity . Advanced directives . List of other physicians . Hospitalizations, surgeries, and ER visits in previous 12 months . Vitals . Screenings to include cognitive, depression, and falls . Referrals and appointments  In addition, I have reviewed and discussed with patient certain preventive protocols, quality metrics, and best practice recommendations. A written personalized care plan for preventive services as well as general preventive health recommendations were provided to patient.     Naaman Plummer [place], [place]  06/23/2016  Kathlene November, MD

## 2016-06-23 ENCOUNTER — Ambulatory Visit (INDEPENDENT_AMBULATORY_CARE_PROVIDER_SITE_OTHER): Payer: Medicare Other | Admitting: Internal Medicine

## 2016-06-23 ENCOUNTER — Encounter: Payer: Self-pay | Admitting: Internal Medicine

## 2016-06-23 VITALS — BP 128/82 | HR 78 | Ht 72.0 in | Wt 189.0 lb

## 2016-06-23 DIAGNOSIS — E785 Hyperlipidemia, unspecified: Secondary | ICD-10-CM

## 2016-06-23 DIAGNOSIS — Z0000 Encounter for general adult medical examination without abnormal findings: Secondary | ICD-10-CM

## 2016-06-23 DIAGNOSIS — K2270 Barrett's esophagus without dysplasia: Secondary | ICD-10-CM | POA: Diagnosis not present

## 2016-06-23 DIAGNOSIS — I159 Secondary hypertension, unspecified: Secondary | ICD-10-CM | POA: Diagnosis not present

## 2016-06-23 DIAGNOSIS — Z125 Encounter for screening for malignant neoplasm of prostate: Secondary | ICD-10-CM

## 2016-06-23 DIAGNOSIS — Z8042 Family history of malignant neoplasm of prostate: Secondary | ICD-10-CM | POA: Diagnosis not present

## 2016-06-23 DIAGNOSIS — I10 Essential (primary) hypertension: Secondary | ICD-10-CM

## 2016-06-23 LAB — CBC WITH DIFFERENTIAL/PLATELET
Basophils Absolute: 0 cells/uL (ref 0–200)
Basophils Relative: 0 %
Eosinophils Absolute: 166 cells/uL (ref 15–500)
Eosinophils Relative: 2 %
HEMATOCRIT: 46.4 % (ref 38.5–50.0)
HEMOGLOBIN: 16.1 g/dL (ref 13.2–17.1)
LYMPHS ABS: 1328 {cells}/uL (ref 850–3900)
Lymphocytes Relative: 16 %
MCH: 33.5 pg — ABNORMAL HIGH (ref 27.0–33.0)
MCHC: 34.7 g/dL (ref 32.0–36.0)
MCV: 96.5 fL (ref 80.0–100.0)
MONO ABS: 1162 {cells}/uL — AB (ref 200–950)
MPV: 10.3 fL (ref 7.5–12.5)
Monocytes Relative: 14 %
Neutro Abs: 5644 cells/uL (ref 1500–7800)
Neutrophils Relative %: 68 %
Platelets: 294 10*3/uL (ref 140–400)
RBC: 4.81 MIL/uL (ref 4.20–5.80)
RDW: 13.2 % (ref 11.0–15.0)
WBC: 8.3 10*3/uL (ref 3.8–10.8)

## 2016-06-23 LAB — COMPREHENSIVE METABOLIC PANEL
ALK PHOS: 59 U/L (ref 40–115)
ALT: 43 U/L (ref 9–46)
AST: 29 U/L (ref 10–35)
Albumin: 4.7 g/dL (ref 3.6–5.1)
BILIRUBIN TOTAL: 0.8 mg/dL (ref 0.2–1.2)
BUN: 12 mg/dL (ref 7–25)
CO2: 26 mmol/L (ref 20–31)
Calcium: 10.1 mg/dL (ref 8.6–10.3)
Chloride: 97 mmol/L — ABNORMAL LOW (ref 98–110)
Creat: 1.11 mg/dL (ref 0.70–1.11)
GLUCOSE: 99 mg/dL (ref 65–99)
Potassium: 3.9 mmol/L (ref 3.5–5.3)
SODIUM: 137 mmol/L (ref 135–146)
Total Protein: 7.4 g/dL (ref 6.1–8.1)

## 2016-06-23 LAB — LIPID PANEL
Cholesterol: 218 mg/dL — ABNORMAL HIGH (ref <200)
HDL: 78 mg/dL (ref >40)
LDL Cholesterol: 123 mg/dL — ABNORMAL HIGH (ref <100)
Total CHOL/HDL Ratio: 2.8 Ratio (ref <5.0)
Triglycerides: 85 mg/dL (ref <150)
VLDL: 17 mg/dL (ref <30)

## 2016-06-23 NOTE — Patient Instructions (Addendum)
GO TO THE LAB : Get the blood work    GO TO THE FRONT DESK Schedule your next appointment for a routine checkup and a Medicare wellness one of our nurses in one year.  Call anytime if you need [place]   Check the  blood pressure 2 or 3 times a month  Be sure your blood pressure is between 110/65 and  140/85. If it is consistently higher or lower, let me know    [name] , Thank you for taking time to come for your Medicare Wellness Visit. I appreciate your ongoing commitment to your health goals. Please review the following plan we discussed and let me know if I can assist you in the future.   These are the goals we discussed: Goals      Patient Stated   . Maintain current healthy lifestyle (pt-stated)       This is a list of the screening recommended for you and due dates:  Health Maintenance  Topic Date Due  . Flu Shot  09/06/2016  . Tetanus Vaccine  05/10/2022  . Pneumonia vaccines  Completed    Continue to eat heart healthy diet (full of fruits, vegetables, whole grains, lean protein, water--limit salt, fat, and sugar intake) and increase physical activity as tolerated.  Continue doing brain stimulating activities (puzzles, reading, adult coloring books, staying active) to keep memory sharp.   Health Maintenance, Male A healthy lifestyle and preventive care is important for your health and wellness. Ask your health care provider about what schedule of regular examinations is right for you. What should I know about weight and diet?  Eat a Healthy Diet  Eat plenty of vegetables, fruits, whole grains, low-fat dairy products, and lean protein.  Do not eat a lot of foods high in solid fats, added sugars, or salt. Maintain a Healthy Weight  Regular exercise can help you achieve or maintain a healthy weight. You should:  Do at least 150 minutes of exercise each week. The exercise should increase your heart rate and make you sweat (moderate-intensity exercise).  Do  strength-training exercises at least twice a week. Watch Your Levels of Cholesterol and Blood Lipids  Have your blood tested for lipids and cholesterol every 5 years starting at 81 years of age. If you are at high risk for heart disease, you should start having your blood tested when you are 81 years old. You may need to have your cholesterol levels checked more often if:  Your lipid or cholesterol levels are high.  You are older than 81 years of age.  You are at high risk for heart disease. What should I know about cancer screening? Many types of cancers can be detected early and may often be prevented. Lung Cancer  You should be screened every year for lung cancer if:  You are a current smoker who has smoked for at least 30 years.  You are a former smoker who has quit within the past 15 years.  Talk to your health care provider about your screening options, when you should start screening, and how often you should be screened. Colorectal Cancer  Routine colorectal cancer screening usually begins at 81 years of age and should be repeated every 5-10 years until you are 81 years old. You may need to be screened more often if early forms of precancerous polyps or small growths are found. Your health care provider may recommend screening at an earlier age if you have risk factors for colon cancer.  Your health care provider may recommend using home test kits to check for hidden blood in the stool.  A small camera at the end of a tube can be used to examine your colon (sigmoidoscopy or colonoscopy). This checks for the earliest forms of colorectal cancer. Prostate and Testicular Cancer  Depending on your age and overall health, your health care provider may do certain tests to screen for prostate and testicular cancer.  Talk to your health care provider about any symptoms or concerns you have about testicular or prostate cancer. Skin Cancer  Check your skin from head to toe  regularly.  Tell your health care provider about any new moles or changes in moles, especially if:  There is a change in a mole's size, shape, or color.  You have a mole that is larger than a pencil eraser.  Always use sunscreen. Apply sunscreen liberally and repeat throughout the day.  Protect yourself by wearing long sleeves, pants, a wide-brimmed hat, and sunglasses when outside. What should I know about heart disease, diabetes, and high blood pressure?  If you are 50-7 years of age, have your blood pressure checked every 3-5 years. If you are 17 years of age or older, have your blood pressure checked every year. You should have your blood pressure measured twice-once when you are at a hospital or clinic, and once when you are not at a hospital or clinic. Record the average of the two measurements. To check your blood pressure when you are not at a hospital or clinic, you can use:  An automated blood pressure machine at a pharmacy.  A home blood pressure monitor.  Talk to your health care provider about your target blood pressure.  If you are between 49-75 years old, ask your health care provider if you should take aspirin to prevent heart disease.  Have regular diabetes screenings by checking your fasting blood sugar level.  If you are at a normal weight and have a low risk for diabetes, have this test once every three years after the age of 45.  If you are overweight and have a high risk for diabetes, consider being tested at a younger age or more often.  A one-time screening for abdominal aortic aneurysm (AAA) by ultrasound is recommended for men aged 77-75 years who are current or former smokers. What should I know about preventing infection? Hepatitis B  If you have a higher risk for hepatitis B, you should be screened for this virus. Talk with your health care provider to find out if you are at risk for hepatitis B infection. Hepatitis C  Blood testing is recommended  for:  Everyone born from [birth year] through 1965.  Anyone with known risk factors for hepatitis C. Sexually Transmitted Diseases (STDs)  You should be screened each year for STDs including gonorrhea and chlamydia if:  You are sexually active and are younger than 81 years of age.  You are older than 80 years of age and your health care provider tells you that you are at risk for this type of infection.  Your sexual activity has changed since you were last screened and you are at an increased risk for chlamydia or gonorrhea. Ask your health care provider if you are at risk.  Talk with your health care provider about whether you are at high risk of being infected with HIV. Your health care provider may recommend a prescription medicine to help prevent HIV infection. What else can I do?  Schedule regular  health, dental, and eye exams.  Stay current with your vaccines (immunizations).  Do not use any tobacco products, such as cigarettes, chewing tobacco, and e-cigarettes. If you need help quitting, ask your health care provider.  Limit alcohol intake to no more than 2 drinks per day. One drink equals 12 ounces of beer, 5 ounces of wine, or 1 ounces of hard liquor.  Do not use street drugs.  Do not share needles.  Ask your health care provider for help if you need support or information about quitting drugs.  Tell your health care provider if you often feel depressed.  Tell your health care provider if you have ever been abused or do not feel safe at home. This information is not intended to replace advice given to you by your health care provider. Make sure you discuss any questions you have with your health care provider. Document Released: 07/22/2007 Document Revised: 09/22/2015 Document Reviewed: 10/27/2014 Elsevier Interactive Patient Education  2017 [employer].

## 2016-06-23 NOTE — Assessment & Plan Note (Addendum)
Had a Medicare wellness today. We discussed prostate cancer screening, declined a DRE, we agreed on check a PSA

## 2016-06-23 NOTE — Progress Notes (Signed)
Subjective:    Patient ID: Chase Hernandez, male    DOB: 29-Jun-1933, 83 y.o.   MRN: [id number]  DOS:  06/23/2016 Type of visit - description : rov Interval history:  Had a Medicare wellness today, has no major concerns. BP is excellent when check, 109, 120 with a normal diastolic. Good medication compliance.   Review of Systems No chest pain or difficulty breathing He remains very active No nausea, vomiting, diarrhea No heartburn, dysphagia or odynophagia  Past Medical History:  Diagnosis Date  . Anemia   . Barrett's esophagus   . Colon polyps 08/13/2012   tubular adenomas  . Hyperlipidemia   . Hypertension   . Kidney stones 2012   h/o  . LBBB (left bundle branch block)   . Neck fracture [facility]) 04-2010   Dr Ronnald Ramp, conservative treatnment  . Osteoarthritis    severe hip-back pain, saw neurology, nerve conduction study 2/11 negative; pain was due to hip arthritis    Past Surgical History:  Procedure Laterality Date  . CATARACT EXTRACTION W/ INTRAOCULAR LENS  IMPLANT, BILATERAL  2013  . TONSILLECTOMY    . TOTAL HIP ARTHROPLASTY     right, surgery April 5,2011 @ [place]  10/2009   left    Social History   Social History  . Marital status: Married    Spouse name: N/A  . Number of children: 1  . Years of education: N/A   Occupational History  . 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  Retired   Social History Main Topics  . Smoking status: Never Smoker  . Smokeless tobacco: Never Used  . Alcohol use 0.0 oz/week     Comment: 6 oz red wine per night  . Drug use: No  . Sexual activity: No   Other Topics Concern  . 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 Always use your most recent med list.          aspirin 81 MG tablet Take 81 mg by mouth daily.   atenolol-chlorthalidone 50-25 MG tablet Commonly known as:  TENORETIC Take 0.5 tablets by mouth daily.   GLUCOSAMINE HCL PO Take by mouth daily.   potassium chloride 10 MEQ tablet Commonly known as:  KLOR-CON 10 Take 1 tablet (10 mEq total) by mouth daily.   ZINC PO Take by mouth.          Objective:   Physical Exam BP 128/82 (BP Location: Right Arm, Patient Position: Sitting, Cuff Size: Normal)   Pulse 78   Ht 6' (1.829 m)   Wt 189 lb (85.7 kg)   SpO2 98%   BMI 25.63 kg/m   General:   Well developed, well nourished . NAD.  Neck: No  thyromegaly  HEENT:  Normocephalic . Face symmetric, atraumatic Lungs:  CTA B Normal respiratory effort, no intercostal retractions, no accessory muscle use. Heart: RRR,  no murmur.  No pretibial edema bilaterally  Abdomen:  Not distended, soft,  non-tender. No rebound or rigidity.   Skin: Exposed areas without rash. Not pale. Not jaundice Neurologic:  alert & oriented X3.  Speech normal, gait appropriate for age and unassisted Strength symmetric and appropriate for age.  Psych: Cognition and judgment appear intact.  Cooperative with normal attention span and concentration.  Behavior appropriate. No anxious or depressed appearing.    Assessment & Plan:   Assessment HTN Hyperlipidemia DJD: See surgeries Neck fracture 2012, Dr. Ronnald Ramp, conservative treatment H/o Kidney stones 2012 Barrett's esophagus H/o anemia: cscope , EGD done 06-2014 : colon polyps,gastritis,+ Barrett's--- f/u prn, no further surveillance scope,  rx PPIs, iron  PLAN: HTN: Seems well-controlled on Tenoretic, check a CMP and CBC Hyperlipidemia: Diet control, check a FLP, fasting for 7 hours Barrett"s: Patient decided to quit  PPIs, asx. RTC one year

## 2016-06-24 LAB — PSA: PSA: 2.3 ng/mL (ref <=4.0)

## 2016-06-25 DIAGNOSIS — K2270 Barrett's esophagus without dysplasia: Secondary | ICD-10-CM | POA: Insufficient documentation

## 2016-06-25 HISTORY — DX: Barrett's esophagus without dysplasia: K22.70

## 2016-06-25 NOTE — Assessment & Plan Note (Signed)
HTN: Seems well-controlled on Tenoretic, check a CMP and CBC Hyperlipidemia: Diet control, check a FLP, fasting for 7 hours Barrett"s: Patient decided to quit PPIs, asx. RTC one year

## 2016-06-27 ENCOUNTER — Other Ambulatory Visit: Payer: Self-pay | Admitting: Internal Medicine

## 2016-09-09 ENCOUNTER — Other Ambulatory Visit: Payer: Self-pay | Admitting: Internal Medicine

## 2016-12-06 DIAGNOSIS — Z23 Encounter for immunization: Secondary | ICD-10-CM | POA: Diagnosis not present

## 2017-06-07 ENCOUNTER — Other Ambulatory Visit: Payer: Self-pay | Admitting: Internal Medicine

## 2017-06-20 NOTE — Progress Notes (Addendum)
Subjective:   Chase Hernandez is a 84 y.o. male who presents for Medicare Annual/Subsequent preventive examination.  Review of Systems: No ROS.  Medicare Wellness Visit. Additional risk factors are reflected in the social history. Cardiac Risk Factors include: advanced age (>21men, >53 women);hypertension;male gender;dyslipidemia Sleep patterns: wakes a couple of times to urinate. Sleeps about 8 hrs.  Home Safety/Smoke Alarms: Feels safe in home. Smoke alarms in place.  Living environment; residence and Adult nurse: lives in one story home with wife.    Male:   CCS-No longer doing routine screening due to age.     PSA-  Lab Results  Component Value Date   PSA 2.3 06/23/2016   PSA 2.54 05/27/2014   PSA 2.28 04/18/2012       Objective:    Vitals: BP 122/60 (BP Location: Left Arm, Patient Position: Sitting, Cuff Size: Normal) Comment: all vitals done by K.Canter CMA @136   Pulse 84   Temp (!) 97.3 F (36.3 C)   Resp 16   Ht 6' (1.829 m)   Wt 179 lb 12.8 oz (81.6 kg)   SpO2 97%   BMI 24.39 kg/m   Body mass index is 24.39 kg/m.  Advanced Directives 06/26/2017 06/23/2016 06/23/2014  Does Patient Have a Medical Advance Directive? Yes Yes No  Type of Paramedic of [place];Living will [place];Living will -  Does patient want to make changes to medical advance directive? No - Patient declined No - Patient declined -  Copy of [place] in Chart? No - copy requested No - copy requested -  Would patient like information on creating a medical advance directive? - - No - patient declined information    Tobacco Social History   Tobacco Use  Smoking Status Never Smoker  Smokeless Tobacco Never Used     Counseling given: Not Answered   Clinical Intake: Pain : No/denies pain     Past Medical History:  Diagnosis Date  . Anemia   . Barrett's esophagus   . Colon polyps 08/13/2012   tubular adenomas  .  Hyperlipidemia   . Hypertension   . Kidney stones 2012   h/o  . LBBB (left bundle branch block)   . Neck fracture [facility]) 04-2010   Dr Ronnald Ramp, conservative treatnment  . Osteoarthritis    severe hip-back pain, saw neurology, nerve conduction study 2/11 negative; pain was due to hip arthritis   Past Surgical History:  Procedure Laterality Date  . CATARACT EXTRACTION W/ INTRAOCULAR LENS  IMPLANT, BILATERAL  2013  . TONSILLECTOMY    . TOTAL HIP ARTHROPLASTY     right, surgery April 5,2011 @ [place]  10/2009   left   Family History  Problem Relation Age of Onset  . Breast cancer Mother   . Diabetes Unknown        GM  . Prostate cancer Father 67  . Heart attack Neg Hx   . Colon cancer Neg Hx    Social History   Socioeconomic History  . Marital status: Married    Spouse name: Not on file  . Number of children: 1  . Years of education: Not on file  . Highest education level: Not on file  Occupational History  . 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 Financial resource strain: Not on file  . Food insecurity:    Worry:  Not on file    Inability: Not on file  . Transportation needs:    Medical: Not on file    Non-medical: Not on file  Tobacco Use  . Smoking status: Never Smoker  . Smokeless tobacco: Never Used  Substance and Sexual Activity  . Alcohol use: Yes    Alcohol/week: 0.0 oz    Comment: 6 oz red wine per night  . Drug use: No  . Sexual activity: Never  Lifestyle  . Physical activity:    Days per week: Not on file    Minutes per session: Not on file  . Stress: Not on file  Relationships  . Social connections:    Talks on phone: Not on file    Gets together: Not on file    Attends religious service: Not on file    Active member of club or organization: Not on file    Attends meetings of clubs or organizations: Not on file    Relationship status:  Not on file  Other Topics Concern  . 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 aspirin 81 MG tablet Take 81 mg by mouth daily.  Marland Kitchen atenolol-chlorthalidone (TENORETIC) 50-25 MG tablet Take 0.5 tablets by mouth daily.  Marland Kitchen GLUCOSAMINE HCL PO Take by mouth daily.  . Multiple Vitamins-Minerals (ZINC PO) Take by mouth.    . potassium chloride (KLOR-CON 10) 10 MEQ tablet Take 1 tablet (10 mEq total) by mouth daily.   No facility-administered encounter medications on file as of 06/26/2017.     Activities of Daily Living In your present state of health, do you have any difficulty performing the following activities: 06/26/2017 06/26/2017  Hearing? Y N  Comment wears hearing aids -  Vision? N N  Difficulty concentrating or making decisions? N N  Walking or climbing stairs? N N  Dressing or bathing? N N  Doing errands, shopping? N N  Preparing Food and eating ? N -  Using the Toilet? N -  In the past six months, have you accidently leaked urine? N -  Do you have problems with loss of bowel control? N -  Managing your Medications? N -  Managing your Finances? N -  Housekeeping or managing your Housekeeping? N -  Some recent data might be hidden    Patient Care Team: Colon Branch, MD as PCP - General Ladene Artist, MD as Consulting Physician (Gastroenterology)   Assessment:   This is a routine wellness examination for Chase Hernandez.Physical assessment deferred to PCP.  Exercise Activities and Dietary recommendations Current Exercise Habits: Home exercise routine, Type of exercise: strength training/weights;stretching, Time (Minutes): 55, Frequency (Times/Week): 4, Weekly Exercise (Minutes/Week): 220 Diet (meal preparation, eat out, water intake, caffeinated beverages,  dairy products, fruits and vegetables): in general, a "healthy" diet  , well balanced Reports drinking at least 32oz per day.  Goals    . maintain current healthy lifestyle.       Fall Risk Fall Risk  06/26/2017 06/26/2017 06/23/2016 06/22/2015 05/27/2014  Falls in the past year? No No No No No    Depression Screen PHQ 2/9 Scores 06/26/2017 06/26/2017 06/23/2016 06/22/2015  PHQ - 2 Score 0 0 0 0    Cognitive Function MMSE - Mini  Mental State Exam 06/26/2017  Orientation to time 5  Orientation to Place 5  Registration 3  Attention/ Calculation 5  Recall 2  Language- name 2 objects 2  Language- repeat 1  Language- follow 3 step command 3  Language- read & follow direction 1  Write a sentence 1  Copy design 1  Total score 29        Immunization History  Administered Date(s) Administered  . Influenza Whole 11/23/2006, 02/08/2009, 12/03/2011  . Influenza-Unspecified 12/26/2013, 12/08/2014, 12/06/2016  . Pneumococcal Conjugate-13 05/23/2013  . Pneumococcal Polysaccharide-23 12/07/2005, 06/26/2017  . Td 04/09/2002  . Tdap 05/09/2012  . Zoster 04/01/2008    Screening Tests Health Maintenance  Topic Date Due  . INFLUENZA VACCINE  09/06/2017  . TETANUS/TDAP  05/10/2022  . PNA vac Low Risk Adult  Completed        Plan:  Follow up with PCP as directed  Please schedule your next medicare wellness visit with me in 1 yr.  Continue to eat heart healthy diet (full of fruits, vegetables, whole grains, lean protein, water--limit salt, fat, and sugar intake) and increase physical activity as tolerated.  Continue doing brain stimulating activities (puzzles, reading, adult coloring books, staying active) to keep memory sharp.     I have personally reviewed and noted the following in the patient's chart:   . Medical and social history . Use of alcohol, tobacco or illicit drugs  . Current medications and supplements . Functional ability and status . Nutritional  status . Physical activity . Advanced directives . List of other physicians . Hospitalizations, surgeries, and ER visits in previous 12 months . Vitals . Screenings to include cognitive, depression, and falls . Referrals and appointments  In addition, I have reviewed and discussed with patient certain preventive protocols, quality metrics, and best practice recommendations. A written personalized care plan for preventive services as well as general preventive health recommendations were provided to patient.     Chase Hernandez, [place]  06/26/2017  Kathlene November, MD

## 2017-06-26 ENCOUNTER — Ambulatory Visit (INDEPENDENT_AMBULATORY_CARE_PROVIDER_SITE_OTHER): Payer: Medicare Other | Admitting: *Deleted

## 2017-06-26 ENCOUNTER — Encounter: Payer: Self-pay | Admitting: *Deleted

## 2017-06-26 ENCOUNTER — Ambulatory Visit (INDEPENDENT_AMBULATORY_CARE_PROVIDER_SITE_OTHER): Payer: Medicare Other | Admitting: Internal Medicine

## 2017-06-26 ENCOUNTER — Encounter: Payer: Self-pay | Admitting: Internal Medicine

## 2017-06-26 VITALS — BP 122/60 | HR 84 | Temp 97.3°F | Resp 16 | Ht 72.0 in | Wt 179.8 lb

## 2017-06-26 VITALS — BP 122/60 | HR 84 | Temp 97.3°F | Resp 16 | Ht 72.0 in | Wt 179.5 lb

## 2017-06-26 DIAGNOSIS — Z23 Encounter for immunization: Secondary | ICD-10-CM

## 2017-06-26 DIAGNOSIS — M150 Primary generalized (osteo)arthritis: Secondary | ICD-10-CM

## 2017-06-26 DIAGNOSIS — Z0000 Encounter for general adult medical examination without abnormal findings: Secondary | ICD-10-CM | POA: Diagnosis not present

## 2017-06-26 DIAGNOSIS — K2270 Barrett's esophagus without dysplasia: Secondary | ICD-10-CM

## 2017-06-26 DIAGNOSIS — D508 Other iron deficiency anemias: Secondary | ICD-10-CM

## 2017-06-26 DIAGNOSIS — I10 Essential (primary) hypertension: Secondary | ICD-10-CM

## 2017-06-26 DIAGNOSIS — M159 Polyosteoarthritis, unspecified: Secondary | ICD-10-CM

## 2017-06-26 DIAGNOSIS — E785 Hyperlipidemia, unspecified: Secondary | ICD-10-CM | POA: Diagnosis not present

## 2017-06-26 MED ORDER — PANTOPRAZOLE SODIUM 20 MG PO TBEC: 20.0000 mg | DELAYED_RELEASE_TABLET | Freq: Every day | ORAL | 4 refills | Status: DC

## 2017-06-26 NOTE — Progress Notes (Signed)
Pre visit review using our clinic review tool, if applicable. No additional management support is needed unless otherwise documented below in the visit note. 

## 2017-06-26 NOTE — Assessment & Plan Note (Addendum)
--   Td: 2014; PNM 23 2007 and today; prevnar-- 2013 ; had a  Zostavax 2010; shingrix not available  -- DEXA 11-2006 normal --CCS: Cscope 08-2007 had a tubular adenoma  CCS--2014--Dr Stark--adenomatous polyps  Coloscopy 06/2014, polyps, no further screening colonoscopy  --Prostate cancer screening: + FH prostate ca; 06/2016: declined  DRE 2018 but PSA was wnl

## 2017-06-26 NOTE — Patient Instructions (Signed)
Follow up with PCP as directed  Please schedule your next medicare wellness visit with me in 1 yr.  Continue to eat heart healthy diet (full of fruits, vegetables, whole grains, lean protein, water--limit salt, fat, and sugar intake) and increase physical activity as tolerated.  Continue doing brain stimulating activities (puzzles, reading, adult coloring books, staying active) to keep memory sharp.    Chase Hernandez , Thank you for taking time to come for your Medicare Wellness Visit. I appreciate your ongoing commitment to your health goals. Please review the following plan we discussed and let me know if I can assist you in the future.   These are the goals we discussed: Goals    . maintain current healthy lifestyle.       This is a list of the screening recommended for you and due dates:  Health Maintenance  Topic Date Due  . Flu Shot  09/06/2017  . Tetanus Vaccine  05/10/2022  . Pneumonia vaccines  Completed    Health Maintenance, Male A healthy lifestyle and preventive care is important for your health and wellness. Ask your health care provider about what schedule of regular examinations is right for you. What should I know about weight and diet? Eat a Healthy Diet  Eat plenty of vegetables, fruits, whole grains, low-fat dairy products, and lean protein.  Do not eat a lot of foods high in solid fats, added sugars, or salt.  Maintain a Healthy Weight Regular exercise can help you achieve or maintain a healthy weight. You should:  Do at least 150 minutes of exercise each week. The exercise should increase your heart rate and make you sweat (moderate-intensity exercise).  Do strength-training exercises at least twice a week.  Watch Your Levels of Cholesterol and Blood Lipids  Have your blood tested for lipids and cholesterol every 5 years starting at 82 years of age. If you are at high risk for heart disease, you should start having your blood tested when you are 82 years  old. You may need to have your cholesterol levels checked more often if: ? Your lipid or cholesterol levels are high. ? You are older than 82 years of age. ? You are at high risk for heart disease.  What should I know about cancer screening? Many types of cancers can be detected early and may often be prevented. Lung Cancer  You should be screened every year for lung cancer if: ? You are a current smoker who has smoked for at least 30 years. ? You are a former smoker who has quit within the past 15 years.  Talk to your health care provider about your screening options, when you should start screening, and how often you should be screened.  Colorectal Cancer  Routine colorectal cancer screening usually begins at 82 years of age and should be repeated every 5-10 years until you are 82 years old. You may need to be screened more often if early forms of precancerous polyps or small growths are found. Your health care provider may recommend screening at an earlier age if you have risk factors for colon cancer.  Your health care provider may recommend using home test kits to check for hidden blood in the stool.  A small camera at the end of a tube can be used to examine your colon (sigmoidoscopy or colonoscopy). This checks for the earliest forms of colorectal cancer.  Prostate and Testicular Cancer  Depending on your age and overall health, your health care provider  may do certain tests to screen for prostate and testicular cancer.  Talk to your health care provider about any symptoms or concerns you have about testicular or prostate cancer.  Skin Cancer  Check your skin from head to toe regularly.  Tell your health care provider about any new moles or changes in moles, especially if: ? There is a change in a mole's size, shape, or color. ? You have a mole that is larger than a pencil eraser.  Always use sunscreen. Apply sunscreen liberally and repeat throughout the day.  Protect  yourself by wearing long sleeves, pants, a wide-brimmed hat, and sunglasses when outside.  What should I know about heart disease, diabetes, and high blood pressure?  If you are 82-22 years of age, have your blood pressure checked every 3-5 years. If you are 67 years of age or older, have your blood pressure checked every year. You should have your blood pressure measured twice-once when you are at a hospital or clinic, and once when you are not at a hospital or clinic. Record the average of the two measurements. To check your blood pressure when you are not at a hospital or clinic, you can use: ? An automated blood pressure machine at a pharmacy. ? A home blood pressure monitor.  Talk to your health care provider about your target blood pressure.  If you are between 13-66 years old, ask your health care provider if you should take aspirin to prevent heart disease.  Have regular diabetes screenings by checking your fasting blood sugar level. ? If you are at a normal weight and have a low risk for diabetes, have this test once every three years after the age of 45. ? If you are overweight and have a high risk for diabetes, consider being tested at a younger age or more often.  A one-time screening for abdominal aortic aneurysm (AAA) by ultrasound is recommended for men aged 39-75 years who are current or former smokers. What should I know about preventing infection? Hepatitis B If you have a higher risk for hepatitis B, you should be screened for this virus. Talk with your health care provider to find out if you are at risk for hepatitis B infection. Hepatitis C Blood testing is recommended for:  Everyone born from [birth year] through 1963.  Anyone with known risk factors for hepatitis C.  Sexually Transmitted Diseases (STDs)  You should be screened each year for STDs including gonorrhea and chlamydia if: ? You are sexually active and are younger than 82 years of age. ? You are older than 82  years of age and your health care provider tells you that you are at risk for this type of infection. ? Your sexual activity has changed since you were last screened and you are at an increased risk for chlamydia or gonorrhea. Ask your health care provider if you are at risk.  Talk with your health care provider about whether you are at high risk of being infected with HIV. Your health care provider may recommend a prescription medicine to help prevent HIV infection.  What else can I do?  Schedule regular health, dental, and eye exams.  Stay current with your vaccines (immunizations).  Do not use any tobacco products, such as cigarettes, chewing tobacco, and e-cigarettes. If you need help quitting, ask your health care provider.  Limit alcohol intake to no more than 2 drinks per day. One drink equals 12 ounces of beer, 5 ounces of wine, or 1  ounces of hard liquor.  Do not use street drugs.  Do not share needles.  Ask your health care provider for help if you need support or information about quitting drugs.  Tell your health care provider if you often feel depressed.  Tell your health care provider if you have ever been abused or do not feel safe at home. This information is not intended to replace advice given to you by your health care provider. Make sure you discuss any questions you have with your health care provider. Document Released: 07/22/2007 Document Revised: 09/22/2015 Document Reviewed: 10/27/2014 Elsevier Interactive Patient Education  [employer].

## 2017-06-26 NOTE — Progress Notes (Signed)
Subjective:    Patient ID: Chase Hernandez, male    DOB: 12/27/1933, 84 y.o.   MRN: [id number]  DOS:  06/26/2017 Type of visit - description : rov Interval history: HTN: Good compliance of medication, ambulatory BPs in the 120s DJD: Essentially takes no pain medicine, he remains active with martial arts and exercises regularly High cholesterol: Diet controlled, doing well. History of Barrett's: On no medications.  Asymptomatic   Review of Systems Denies chest pain or difficulty breathing. No lower extremity edema No GERD symptoms, no dysphagia or odynophagia  Past Medical History:  Diagnosis Date  . Anemia   . Barrett's esophagus   . Colon polyps 08/13/2012   tubular adenomas  . Hyperlipidemia   . Hypertension   . Kidney stones 2012   h/o  . LBBB (left bundle branch block)   . Neck fracture [facility]) 04-2010   Dr Ronnald Ramp, conservative treatnment  . Osteoarthritis    severe hip-back pain, saw neurology, nerve conduction study 2/11 negative; pain was due to hip arthritis    Past Surgical History:  Procedure Laterality Date  . CATARACT EXTRACTION W/ INTRAOCULAR LENS  IMPLANT, BILATERAL  2013  . TONSILLECTOMY    . TOTAL HIP ARTHROPLASTY     right, surgery April 5,2011 @ [place]  10/2009   left    Social History   Socioeconomic History  . Marital status: Married    Spouse name: Not on file  . Number of children: 1  . Years of education: Not on file  . Highest education level: Not on file  Occupational History  . 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[employer]), retired Financial trader: RETIRED  Social Needs  . Financial resource strain: Not on file  . Food insecurity:    Worry: Not on file    Inability: Not on file  . Transportation needs:    Medical: Not on file    Non-medical: Not on file  Tobacco Use  . Smoking status: Never Smoker  . Smokeless tobacco: Never Used  Substance and Sexual Activity  . Alcohol  use: Yes    Alcohol/week: 0.0 oz    Comment: 6 oz red wine per night  . Drug use: No  . Sexual activity: Never  Lifestyle  . Physical activity:    Days per week: Not on file    Minutes per session: Not on file  . Stress: Not on file  Relationships  . Social connections:    Talks on phone: Not on file    Gets together: Not on file    Attends religious service: Not on file    Active member of club or organization: Not on file    Attends meetings of clubs or organizations: Not on file    Relationship status: Not on file  . Intimate partner violence:    Fear of current or ex partner: Not on file    Emotionally abused: Not on file    Physically abused: Not on file    Forced sexual activity: Not on file  Other Topics Concern  . 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 Always use your most recent med list.          aspirin 81 MG tablet Take 81 mg by mouth daily.   atenolol-chlorthalidone 50-25 MG tablet Commonly known as:  TENORETIC Take 0.5 tablets by mouth daily.   GLUCOSAMINE HCL PO Take by mouth daily.   pantoprazole 20 MG tablet Commonly known as:  PROTONIX Take 1 tablet (20 mg total) by mouth daily. Take on empty stomach early in the morning   potassium chloride 10 MEQ tablet Commonly known as:  KLOR-CON 10 Take 1 tablet (10 mEq total) by mouth daily.   ZINC PO Take by mouth.          Objective:   Physical Exam BP 122/60 (BP Location: Left Arm, Patient Position: Sitting, Cuff Size: Small)   Pulse 84   Temp (!) 97.3 F (36.3 C) (Oral)   Resp 16   Ht 6' (1.829 m)   Wt 179 lb 8 oz (81.4 kg)    SpO2 97%   BMI 24.34 kg/m  General:   Well developed, well nourished . NAD.  Neck: No  thyromegaly  HEENT:  Normocephalic . Face symmetric, atraumatic Lungs:  CTA B Normal respiratory effort, no intercostal retractions, no accessory muscle use. Heart: RRR,  no murmur.  No pretibial edema bilaterally  Abdomen:  Not distended, soft, non-tender. No rebound or rigidity.   Skin: Exposed areas without rash. Not pale. Not jaundice Neurologic:  alert & oriented X3.  Speech normal, gait appropriate for age and unassisted Strength symmetric and appropriate for age.  Psych: Cognition and judgment appear intact.  Cooperative with normal attention span and concentration.  Behavior appropriate. No anxious or depressed appearing.     Assessment & Plan:    Assessment HTN Hyperlipidemia DJD: See surgeries Neck fracture 2012, Dr. Ronnald Ramp, conservative treatment H/o Kidney stones 2012 Barrett's esophagus H/o anemia: cscope , EGD done 06-2014 : colon polyps, erosive esophagitis and gastritis,+ Barrett's--- f/u prn, no further surveillance scope,  rx PPIs low-dose/long-term  PLAN: HTN: Controlled on Tenoretic, checking BMP and CBC Dyslipidemia: Diet controlled, check a FLP Barrett's esophagus: Not taking PPIs, asymptomatic.  Per GI notes, he needed long-term low-dose PPIs.  Recommend Protonix 20 mg daily.  Rx sent. DJD: remains active, on no meds  Preventive care reviewed RTC 1 year

## 2017-06-26 NOTE — Patient Instructions (Signed)
GO TO THE LAB : Get the blood work     GO TO THE FRONT DESK Schedule your next appointment for a checkup in 1 year    Check the  blood pressure 2 or 3 times a month   Be sure your blood pressure is between 110/65 and  135/85. If it is consistently higher or lower, let me know    

## 2017-06-27 LAB — CBC WITH DIFFERENTIAL/PLATELET
BASOS PCT: 0.9 % (ref 0.0–3.0)
Basophils Absolute: 0.1 10*3/uL (ref 0.0–0.1)
EOS PCT: 1.8 % (ref 0.0–5.0)
Eosinophils Absolute: 0.1 10*3/uL (ref 0.0–0.7)
HEMATOCRIT: 45.9 % (ref 39.0–52.0)
HEMOGLOBIN: 16 g/dL (ref 13.0–17.0)
LYMPHS PCT: 14.3 % (ref 12.0–46.0)
Lymphs Abs: 1.2 10*3/uL (ref 0.7–4.0)
MCHC: 34.9 g/dL (ref 30.0–36.0)
MCV: 98.5 fl (ref 78.0–100.0)
Monocytes Absolute: 1 10*3/uL (ref 0.1–1.0)
Monocytes Relative: 12.2 % — ABNORMAL HIGH (ref 3.0–12.0)
Neutro Abs: 5.9 10*3/uL (ref 1.4–7.7)
Neutrophils Relative %: 70.8 % (ref 43.0–77.0)
Platelets: 296 10*3/uL (ref 150.0–400.0)
RBC: 4.66 Mil/uL (ref 4.22–5.81)
RDW: 12.7 % (ref 11.5–15.5)
WBC: 8.4 10*3/uL (ref 4.0–10.5)

## 2017-06-27 LAB — BASIC METABOLIC PANEL
BUN: 13 mg/dL (ref 6–23)
CALCIUM: 10.4 mg/dL (ref 8.4–10.5)
CO2: 31 mEq/L (ref 19–32)
CREATININE: 0.91 mg/dL (ref 0.40–1.50)
Chloride: 96 mEq/L (ref 96–112)
GFR: 84.74 mL/min (ref >60.00)
GLUCOSE: 106 mg/dL — AB (ref 70–99)
Potassium: 4.4 mEq/L (ref 3.5–5.1)
Sodium: 137 mEq/L (ref 135–145)

## 2017-06-27 LAB — LIPID PANEL
CHOL/HDL RATIO: 3
Cholesterol: 201 mg/dL — ABNORMAL HIGH (ref 0–200)
HDL: 64.9 mg/dL (ref >39.00)
LDL Cholesterol: 121 mg/dL — ABNORMAL HIGH (ref 0–99)
NONHDL: 135.77
Triglycerides: 72 mg/dL (ref 0.0–149.0)
VLDL: 14.4 mg/dL (ref 0.0–40.0)

## 2017-06-27 NOTE — Assessment & Plan Note (Addendum)
HTN: Controlled on Tenoretic, checking BMP and CBC Dyslipidemia: Diet controlled, check a FLP Barrett's esophagus: Not taking PPIs, asymptomatic.  Per GI notes, he needed long-term low-dose PPIs.  Recommend Protonix 20 mg daily.  Rx sent. DJD: remains active, on no meds  Preventive care reviewed RTC 1 year

## 2017-07-01 ENCOUNTER — Other Ambulatory Visit: Payer: Self-pay | Admitting: Internal Medicine

## 2017-07-04 ENCOUNTER — Ambulatory Visit (INDEPENDENT_AMBULATORY_CARE_PROVIDER_SITE_OTHER): Payer: Medicare Other | Admitting: Family Medicine

## 2017-07-04 VITALS — BP 120/70 | HR 76 | Temp 98.2°F | Wt 179.0 lb

## 2017-07-04 DIAGNOSIS — J069 Acute upper respiratory infection, unspecified: Secondary | ICD-10-CM | POA: Diagnosis not present

## 2017-07-04 NOTE — Assessment & Plan Note (Signed)
Symptomatic therapy suggested: push fluids, rest, gargle warm salt water, throat lozenges, warm tea with honey and return office visit prn if symptoms persist or worsen. Lack of antibiotic effectiveness discussed with him. Call or return to clinic prn if these symptoms worsen or fail to improve as anticipated.

## 2017-07-04 NOTE — Patient Instructions (Signed)

## 2017-07-04 NOTE — Progress Notes (Signed)
Chase Hernandez - 84 y.o. male MRN [id number]  Date of birth: 13-Feb-1933  Subjective Chief Complaint  Patient presents with  . Sore Throat  . Cough    HPI Chase Hernandez is a 84 y.o. male here today for a same day visit with complaint of URI symptoms.  Symptoms began ~4 days ago and include sore throat, cough with green phlegm, nasal congestion and post nasal drainage.  He has tried tylenol which provides some pain relief.  He is drinking some fluids but admits that fluid intake could be better.  He has not had fever, chills, headache, sinus pain, vision changes, rash, body aches, chest pain or sob.  ROS: ROS completed and negative except as noted per HPI   Allergies  Allergen Reactions  . Oxycodone     Other reaction(s): Other (See Comments) Other Reaction: fecal impaction    Past Medical History:  Diagnosis Date  . Anemia   . Barrett's esophagus   . Colon polyps 08/13/2012   tubular adenomas  . Hyperlipidemia   . Hypertension   . Kidney stones 2012   h/o  . LBBB (left bundle branch block)   . Neck fracture [facility]) 04-2010   Dr Ronnald Ramp, conservative treatnment  . Osteoarthritis    severe hip-back pain, saw neurology, nerve conduction study 2/11 negative; pain was due to hip arthritis    Past Surgical History:  Procedure Laterality Date  . CATARACT EXTRACTION W/ INTRAOCULAR LENS  IMPLANT, BILATERAL  2013  . TONSILLECTOMY    . TOTAL HIP ARTHROPLASTY     right, surgery April 5,2011 @ [place]  10/2009   left    Social History   Socioeconomic History  . Marital status: Married    Spouse name: Not on file  . Number of children: 1  . Years of education: Not on file  . Highest education level: Not on file  Occupational History  . 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 Financial resource strain: Not on file  . Food insecurity:    Worry: Not on file   Inability: Not on file  . Transportation needs:    Medical: Not on file    Non-medical: Not on file  Tobacco Use  . Smoking status: Never Smoker  . Smokeless tobacco: Never Used  Substance and Sexual Activity  . Alcohol use: Yes    Alcohol/week: 0.0 oz    Comment: 6 oz red wine per night  . Drug use: No  . Sexual activity: Never  Lifestyle  . Physical activity:    Days per week: Not on file    Minutes per session: Not on file  . Stress: Not on file  Relationships  . Social connections:    Talks on phone: Not on file    Gets together: Not on file    Attends religious service: Not on file    Active member of club or organization: Not on file    Attends meetings of clubs or organizations: Not on file    Relationship status: Not on file  Other Topics Concern  . 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  Breast cancer Mother   . Diabetes Unknown        GM  . Prostate cancer Father 56  . Heart attack Neg Hx   . Colon cancer Neg Hx     Health Maintenance  Topic Date Due  . INFLUENZA VACCINE  09/06/2017  . TETANUS/TDAP  05/10/2022  . PNA vac Low Risk Adult  Completed    ----------------------------------------------------------------------------------------------------------------------------------------------------------------------------------------------------------------- Physical Exam BP 120/70   Pulse 76   Temp 98.2 F (36.8 C) (Oral)   Wt 179 lb (81.2 kg)   SpO2 96%   BMI 24.28 kg/m   Physical Exam  Constitutional: He is oriented to person, place, and time. He appears well-nourished.  Non-toxic appearance. He does not appear ill.  HENT:  Head: Normocephalic and atraumatic.  Right Ear: Tympanic membrane normal.  Left Ear: Tympanic membrane normal.    Mouth/Throat: Uvula is midline and mucous membranes are normal. Posterior oropharyngeal erythema present. No tonsillar exudate.  Neck: Neck supple. No thyromegaly present.  Cardiovascular: Normal rate and regular rhythm.  Pulmonary/Chest: Effort normal and breath sounds normal.  Lymphadenopathy:    He has no cervical adenopathy.  Neurological: He is alert and oriented to person, place, and time.  Skin: Rash noted.  Psychiatric: He has a normal mood and affect. His behavior is normal.    ------------------------------------------------------------------------------------------------------------------------------------------------------------------------------------------------------------------- Assessment and Plan  Viral URI Symptomatic therapy suggested: push fluids, rest, gargle warm salt water, throat lozenges, warm tea with honey and return office visit prn if symptoms persist or worsen. Lack of antibiotic effectiveness discussed with him. Call or return to clinic prn if these symptoms worsen or fail to improve as anticipated.

## 2017-09-04 ENCOUNTER — Other Ambulatory Visit: Payer: Self-pay | Admitting: Internal Medicine

## 2017-11-14 DIAGNOSIS — Z23 Encounter for immunization: Secondary | ICD-10-CM | POA: Diagnosis not present

## 2018-05-23 ENCOUNTER — Other Ambulatory Visit: Payer: Self-pay | Admitting: Internal Medicine

## 2018-06-25 ENCOUNTER — Other Ambulatory Visit: Payer: Self-pay | Admitting: Internal Medicine

## 2018-06-28 ENCOUNTER — Ambulatory Visit: Payer: Medicare Other | Admitting: *Deleted

## 2018-06-28 ENCOUNTER — Ambulatory Visit: Payer: Medicare Other | Admitting: Internal Medicine

## 2018-08-15 ENCOUNTER — Other Ambulatory Visit: Payer: Self-pay | Admitting: Internal Medicine

## 2018-09-02 NOTE — Progress Notes (Addendum)
Subjective:   Chase Hernandez is a 85 y.o. male who presents for Medicare Annual/Subsequent preventive examination.  Review of Systems: No ROS.   Sleep patterns: no issues  Home Safety/Smoke Alarms: Feels safe in home. Smoke alarms in place.  Lives in one story home with wife. Uses cane just for extra safety.   Male:      PSA-  Lab Results  Component Value Date   PSA 2.3 06/23/2016   PSA 2.54 05/27/2014   PSA 2.28 04/18/2012       Objective:    Vitals: BP (!) 154/96 (BP Location: Left Arm, Patient Position: Sitting, Cuff Size: Small) Comment: all vitals done by CMA in today's PCP visit  Pulse (!) 57   Ht 6' (1.829 m)   Wt 179 lb (81.2 kg)   SpO2 93%   BMI 24.28 kg/m   Body mass index is 24.28 kg/m.  Advanced Directives 09/03/2018 06/26/2017 06/23/2016 06/23/2014  Does Patient Have a Medical Advance Directive? Yes Yes Yes No  Type of Paramedic of [place];Living will [place];Living will [place];Living will -  Does patient want to make changes to medical advance directive? No - Patient declined No - Patient declined No - Patient declined -  Copy of [place] in Chart? No - copy requested No - copy requested No - copy requested -  Would patient like information on creating a medical advance directive? - - - No - patient declined information    Tobacco Social History   Tobacco Use  Smoking Status Never Smoker  Smokeless Tobacco Never Used     Counseling given: Not Answered   Clinical Intake:  Pain : No/denies pain     Past Medical History:  Diagnosis Date  . Anemia   . Barrett's esophagus   . Colon polyps 08/13/2012   tubular adenomas  . Hyperlipidemia   . Hypertension   . Kidney stones 2012   h/o  . LBBB (left bundle branch block)   . Neck fracture [facility]) 04-2010   Dr Ronnald Ramp, conservative treatnment  . Osteoarthritis    severe hip-back pain, saw neurology, nerve  conduction study 2/11 negative; pain was due to hip arthritis   Past Surgical History:  Procedure Laterality Date  . CATARACT EXTRACTION W/ INTRAOCULAR LENS  IMPLANT, BILATERAL  2013  . TONSILLECTOMY    . TOTAL HIP ARTHROPLASTY     right, surgery April 5,2011 @ [place]  10/2009   left   Family History  Problem Relation Age of Onset  . Breast cancer Mother   . Diabetes Other        GM  . Prostate cancer Father 36  . Heart attack Neg Hx   . Colon cancer Neg Hx    Social History   Socioeconomic History  . Marital status: Married    Spouse name: Not on file  . Number of children: 1  . Years of education: Not on file  . Highest education level: Not on file  Occupational History  . 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 Financial resource strain: Not on file  . Food insecurity    Worry: Not on file    Inability: Not on file  . Transportation needs    Medical: Not on file    Non-medical: Not on file  Tobacco Use  .  Smoking status: Never Smoker  . Smokeless tobacco: Never Used  Substance and Sexual Activity  . Alcohol use: Yes    Alcohol/week: 0.0 standard drinks    Comment: 6 oz red wine per night  . Drug use: No  . Sexual activity: Never  Lifestyle  . Physical activity    Days per week: Not on file    Minutes per session: Not on file  . Stress: Not on file  Relationships  . Social Herbalist on phone: Not on file    Gets together: Not on file    Attends religious service: Not on file    Active member of club or organization: Not on file    Attends meetings of clubs or organizations: Not on file    Relationship status: Not on file  Other Topics Concern  . 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 atenolol-chlorthalidone (TENORETIC) 50-25 MG tablet Take 0.5 tablets by mouth daily.  Marland Kitchen GLUCOSAMINE HCL PO Take by mouth daily.  . Multiple Vitamins-Minerals (ZINC PO) Take by mouth.    . pantoprazole (PROTONIX) 20 MG tablet Take 1 tablet (20 mg total) by mouth daily. Take on empty stomach early in the morning  . potassium chloride (K-DUR) 10 MEQ tablet Take 1 tablet (10 mEq total) by mouth daily.   No facility-administered encounter medications on file as of 09/03/2018.     Activities of Daily Living In your present state of health, do you have any difficulty performing the following activities: 09/03/2018 09/03/2018  Hearing? Y Y  Comment wearing hearing aids. -  Vision? N N  Difficulty concentrating or making decisions? N N  Walking or climbing stairs? N N  Dressing or bathing? N N  Doing errands, shopping? N N  Preparing Food and eating ? N -  Using the Toilet? N -  In the past six months, have you accidently leaked urine? N -  Do you have problems with loss of bowel control? N -  Managing your Medications? N -  Managing your Finances? N -  Housekeeping or managing your Housekeeping? N -  Some recent data might be hidden    Patient Care Team: Colon Branch, MD as PCP - General Ladene Artist, MD as Consulting Physician (Gastroenterology)   Assessment:   This is a routine wellness examination for Chase Hernandez. Physical assessment deferred to PCP.  Exercise Activities and Dietary recommendations Current Exercise Habits: Home exercise routine, Type of exercise: stretching;yoga;strength training/weights, Time (Minutes): 30, Frequency (Times/Week): 7, Weekly Exercise (Minutes/Week): 210, Intensity: Mild, Exercise limited by: None identified Diet (meal preparation, eat out, water intake, caffeinated beverages, dairy products, fruits and vegetables): Pt reports he eats a healthy mediterranean diet and  drinks plenty of water.  Goals    . Maintain current healthy lifestyle (pt-stated)    . maintain current healthy lifestyle.       Fall Risk Fall Risk  09/03/2018 09/03/2018 06/26/2017 06/26/2017 06/23/2016  Falls in the past year? 0 0 No No No    Depression Screen PHQ 2/9 Scores 09/03/2018 09/03/2018 06/26/2017 06/26/2017  PHQ - 2 Score 0 0 0 0    Cognitive Function Ad8 score reviewed for issues:  Issues making decisions:no  Less interest in hobbies / activities:no  Repeats questions, stories (family complaining):no  Trouble using ordinary gadgets (microwave, computer, phone):no  Forgets the month or year: no  Mismanaging finances: no  Remembering appts:no  Daily problems with thinking and/or memory:no Ad8 score is=0     MMSE - Mini Mental State Exam 06/26/2017  Orientation to time 5  Orientation to Place 5  Registration 3  Attention/ Calculation 5  Recall 2  Language- name 2 objects 2  Language- repeat 1  Language- follow 3 step command 3  Language- read & follow direction 1  Write a sentence 1  Copy design 1  Total score 29        Immunization History  Administered Date(s) Administered  . Influenza Split 11/14/2017  . Influenza Whole 11/23/2006, 02/08/2009, 12/03/2011  . Influenza-Unspecified 12/26/2013, 12/08/2014, 12/06/2016  . Pneumococcal Conjugate-13 05/23/2013  . Pneumococcal Polysaccharide-23 12/07/2005, 06/26/2017  . Td 04/09/2002  . Tdap 05/09/2012  . Zoster 04/01/2008   Screening Tests Health Maintenance  Topic Date Due  . INFLUENZA VACCINE  09/07/2018  . TETANUS/TDAP  05/10/2022  . PNA vac Low Risk Adult  Completed       Plan:    Please schedule your next medicare wellness visit with me in 1 yr.  Continue to eat heart healthy diet (full of fruits, vegetables, whole grains, lean protein, water--limit salt, fat, and sugar intake) and increase physical activity as tolerated.  Continue doing brain stimulating activities (puzzles,  reading, adult coloring books, staying active) to keep memory sharp.   Bring a copy of your living will and/or healthcare power of attorney to your next office visit.   I have personally reviewed and noted the following in the patient's chart:   . Medical and social history . Use of alcohol, tobacco or illicit drugs  . Current medications and supplements . Functional ability and status . Nutritional status . Physical activity . Advanced directives . List of other physicians . Hospitalizations, surgeries, and ER visits in previous 12 months . Vitals . Screenings to include cognitive, depression, and falls . Referrals and appointments  In addition, I have reviewed and discussed with patient certain preventive protocols, quality metrics, and best practice recommendations. A written personalized care plan for preventive services as well as general preventive health recommendations were provided to patient.     Naaman Plummer [place], [place]  09/03/2018  Kathlene November, MD

## 2018-09-03 ENCOUNTER — Encounter: Payer: Self-pay | Admitting: *Deleted

## 2018-09-03 ENCOUNTER — Encounter: Payer: Self-pay | Admitting: Internal Medicine

## 2018-09-03 ENCOUNTER — Ambulatory Visit (INDEPENDENT_AMBULATORY_CARE_PROVIDER_SITE_OTHER): Payer: Medicare Other | Admitting: *Deleted

## 2018-09-03 ENCOUNTER — Ambulatory Visit (INDEPENDENT_AMBULATORY_CARE_PROVIDER_SITE_OTHER): Payer: Medicare Other | Admitting: Internal Medicine

## 2018-09-03 ENCOUNTER — Other Ambulatory Visit: Payer: Self-pay

## 2018-09-03 VITALS — BP 154/96 | HR 57 | Ht 72.0 in | Wt 179.0 lb

## 2018-09-03 VITALS — BP 154/96 | HR 57 | Temp 98.0°F | Resp 18 | Ht 72.0 in | Wt 179.5 lb

## 2018-09-03 DIAGNOSIS — Z0000 Encounter for general adult medical examination without abnormal findings: Secondary | ICD-10-CM

## 2018-09-03 DIAGNOSIS — I499 Cardiac arrhythmia, unspecified: Secondary | ICD-10-CM | POA: Diagnosis not present

## 2018-09-03 DIAGNOSIS — K2270 Barrett's esophagus without dysplasia: Secondary | ICD-10-CM | POA: Diagnosis not present

## 2018-09-03 DIAGNOSIS — I10 Essential (primary) hypertension: Secondary | ICD-10-CM | POA: Diagnosis not present

## 2018-09-03 DIAGNOSIS — E785 Hyperlipidemia, unspecified: Secondary | ICD-10-CM | POA: Diagnosis not present

## 2018-09-03 DIAGNOSIS — N400 Enlarged prostate without lower urinary tract symptoms: Secondary | ICD-10-CM | POA: Diagnosis not present

## 2018-09-03 MED ORDER — SHINGRIX 50 MCG/0.5ML IM SUSR: 0.5000 mL | Freq: Once | INTRAMUSCULAR | 1 refills | Status: AC

## 2018-09-03 NOTE — Patient Instructions (Addendum)
GO TO THE LAB : Get the blood work     GO TO THE FRONT DESK Schedule your next appointment   for checkup in 6 months  Continue checking your blood pressures.  BP GOAL is between 110/65 and  135/85. If it is consistently higher or lower, let me know

## 2018-09-03 NOTE — Patient Instructions (Signed)
Please schedule your next medicare wellness visit with me in 1 yr.  Continue to eat heart healthy diet (full of fruits, vegetables, whole grains, lean protein, water--limit salt, fat, and sugar intake) and increase physical activity as tolerated.  Continue doing brain stimulating activities (puzzles, reading, adult coloring books, staying active) to keep memory sharp.   Bring a copy of your living will and/or healthcare power of attorney to your next office visit.   [name] , Thank you for taking time to come for your Medicare Wellness Visit. I appreciate your ongoing commitment to your health goals. Please review the following plan we discussed and let me know if I can assist you in the future.   These are the goals we discussed: Goals    . Maintain current healthy lifestyle (pt-stated)       This is a list of the screening recommended for you and due dates:  Health Maintenance  Topic Date Due  . Flu Shot  09/07/2018  . Tetanus Vaccine  05/10/2022  . Pneumonia vaccines  Completed    Health Maintenance After Age 48 After age 80, you are at a higher risk for certain long-term diseases and infections as well as injuries from falls. Falls are a major cause of broken bones and head injuries in people who are older than age 44. Getting regular preventive care can help to keep you healthy and well. Preventive care includes getting regular testing and making lifestyle changes as recommended by your health care provider. Talk with your health care provider about:  Which screenings and tests you should have. A screening is a test that checks for a disease when you have no symptoms.  A diet and exercise plan that is right for you. What should I know about screenings and tests to prevent falls? Screening and testing are the best ways to find a health problem early. Early diagnosis and treatment give you the best chance of managing medical conditions that are common after age 74. Certain  conditions and lifestyle choices may make you more likely to have a fall. Your health care provider may recommend:  Regular vision checks. Poor vision and conditions such as cataracts can make you more likely to have a fall. If you wear glasses, make sure to get your prescription updated if your vision changes.  Medicine review. Work with your health care provider to regularly review all of the medicines you are taking, including over-the-counter medicines. Ask your health care provider about any side effects that may make you more likely to have a fall. Tell your health care provider if any medicines that you take make you feel dizzy or sleepy.  Osteoporosis screening. Osteoporosis is a condition that causes the bones to get weaker. This can make the bones weak and cause them to break more easily.  Blood pressure screening. Blood pressure changes and medicines to control blood pressure can make you feel dizzy.  Strength and balance checks. Your health care provider may recommend certain tests to check your strength and balance while standing, walking, or changing positions.  Foot health exam. Foot pain and numbness, as well as not wearing proper footwear, can make you more likely to have a fall.  Depression screening. You may be more likely to have a fall if you have a fear of falling, feel emotionally low, or feel unable to do activities that you used to do.  Alcohol use screening. Using too much alcohol can affect your balance and may make you  more likely to have a fall. What actions can I take to lower my risk of falls? General instructions  Talk with your health care provider about your risks for falling. Tell your health care provider if: ? You fall. Be sure to tell your health care provider about all falls, even ones that seem minor. ? You feel dizzy, sleepy, or off-balance.  Take over-the-counter and prescription medicines only as told by your health care provider. These include any  supplements.  Eat a healthy diet and maintain a healthy weight. A healthy diet includes low-fat dairy products, low-fat (lean) meats, and fiber from whole grains, beans, and lots of fruits and vegetables. Home safety  Remove any tripping hazards, such as rugs, cords, and clutter.  Install safety equipment such as grab bars in bathrooms and safety rails on stairs.  Keep rooms and walkways well-lit. Activity   Follow a regular exercise program to stay fit. This will help you maintain your balance. Ask your health care provider what types of exercise are appropriate for you.  If you need a cane or walker, use it as recommended by your health care provider.  Wear supportive shoes that have nonskid soles. Lifestyle  Do not drink alcohol if your health care provider tells you not to drink.  If you drink alcohol, limit how much you have: ? 0-1 drink a day for women. ? 0-2 drinks a day for men.  Be aware of how much alcohol is in your drink. In the [place] one drink equals one typical bottle of beer (12 oz), one-half glass of wine (5 oz), or one shot of hard liquor (1 oz).  Do not use any products that contain nicotine or tobacco, such as cigarettes and e-cigarettes. If you need help quitting, ask your health care provider. Summary  Having a healthy lifestyle and getting preventive care can help to protect your health and wellness after age 75.  Screening and testing are the best way to find a health problem early and help you avoid having a fall. Early diagnosis and treatment give you the best chance for managing medical conditions that are more common for people who are older than age 33.  Falls are a major cause of broken bones and head injuries in people who are older than age 76. Take precautions to prevent a fall at home.  Work with your health care provider to learn what changes you can make to improve your health and wellness and to prevent falls. This information is not intended  to replace advice given to you by your health care provider. Make sure you discuss any questions you have with your health care provider. Document Released: 12/06/2016 Document Revised: 05/16/2018 Document Reviewed: 12/06/2016 Elsevier Patient Education  2020 [employer].

## 2018-09-03 NOTE — Progress Notes (Signed)
Subjective:    Patient ID: Chase Hernandez, male    DOB: Jun 13, 1933, 84 y.o.   MRN: [id number]  DOS:  09/03/2018 Type of visit - description: Routine office visit In general feels well HTN: Good med compliance, ambulatory BPs are great. GERD: On PPIs, asymptomatic DJD: He remains active, martial arts.   Review of Systems Essentially asymptomatic, specifically denies chest pain, difficulty breathing, LUTS, dysphasia or odynophagia  Past Medical History:  Diagnosis Date  . Anemia   . Barrett's esophagus   . Colon polyps 08/13/2012   tubular adenomas  . Hyperlipidemia   . Hypertension   . Kidney stones 2012   h/o  . LBBB (left bundle branch block)   . Neck fracture [facility]) 04-2010   Dr Ronnald Ramp, conservative treatnment  . Osteoarthritis    severe hip-back pain, saw neurology, nerve conduction study 2/11 negative; pain was due to hip arthritis    Past Surgical History:  Procedure Laterality Date  . CATARACT EXTRACTION W/ INTRAOCULAR LENS  IMPLANT, BILATERAL  2013  . TONSILLECTOMY    . TOTAL HIP ARTHROPLASTY     right, surgery April 5,2011 @ [place]  10/2009   left    Social History   Socioeconomic History  . Marital status: Married    Spouse name: Not on file  . Number of children: 1  . Years of education: Not on file  . Highest education level: Not on file  Occupational History  . 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 Financial resource strain: Not on file  . Food insecurity    Worry: Not on file    Inability: Not on file  . Transportation needs    Medical: Not on file    Non-medical: Not on file  Tobacco Use  . Smoking status: Never Smoker  . Smokeless tobacco: Never Used  Substance and Sexual Activity  . Alcohol use: Yes    Alcohol/week: 0.0 standard drinks    Comment: 6 oz red wine per night  . Drug use: No  . Sexual activity: Never   Lifestyle  . Physical activity    Days per week: Not on file    Minutes per session: Not on file  . Stress: Not on file  Relationships  . Social Herbalist on phone: Not on file    Gets together: Not on file    Attends religious service: Not on file    Active member of club or organization: Not on file    Attends meetings of clubs or organizations: Not on file    Relationship status: Not on file  . Intimate partner violence    Fear of current or ex partner: Not on file    Emotionally abused: Not on file    Physically abused: Not on file    Forced sexual activity: Not on file  Other Topics Concern  . 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 Breast cancer Mother   . Diabetes Other        GM  . Prostate cancer Father 66  . Heart attack Neg Hx   . Colon  cancer Neg Hx      Allergies as of 09/03/2018      Reactions   Oxycodone    Other reaction(s): Other (See Comments) Other Reaction: fecal impaction      Medication List       Accurate as of September 03, 2018  1:26 PM. If you have any questions, ask your nurse or doctor.        atenolol-chlorthalidone 50-25 MG tablet Commonly known as: TENORETIC Take 0.5 tablets by mouth daily.   GLUCOSAMINE HCL PO Take by mouth daily.   pantoprazole 20 MG tablet Commonly known as: PROTONIX Take 1 tablet (20 mg total) by mouth daily. Take on empty stomach early in the morning   potassium chloride 10 MEQ tablet Commonly known as: K-DUR Take 1 tablet (10 mEq total) by mouth daily.   ZINC PO Take by mouth.           Objective:   Physical Exam BP (!) 154/96 (BP Location: Left Arm, Patient Position: Sitting, Cuff Size: Small)   Pulse (!) 57   Temp 98 F (36.7 C) (Oral)   Resp 18   Ht 6' (1.829 m)    Wt 179 lb 8 oz (81.4 kg)   SpO2 93%   BMI 24.34 kg/m  General: Well developed, NAD, BMI noted Neck: No  thyromegaly  HEENT:  Normocephalic . Face symmetric, atraumatic Lungs:  CTA B Normal respiratory effort, no intercostal retractions, no accessory muscle use. Heart: Seems irregular,  no murmur.  No pretibial edema bilaterally  Abdomen:  Not distended, soft, non-tender. No rebound or rigidity.   Skin: Exposed areas without rash. Not pale. Not jaundice Neurologic:  alert & oriented X3.  Speech normal, gait appropriate for age, assisted by a cane Strength symmetric and appropriate for age.  Psych: Cognition and judgment appear intact.  Cooperative with normal attention span and concentration.  Behavior appropriate. No anxious or depressed appearing.     Assessment    Assessment HTN Hyperlipidemia DJD: See surgeries Neck fracture 2012, Dr. Ronnald Ramp, conservative treatment H/o Kidney stones 2012 Barrett's esophagus H/o anemia: cscope , EGD done 06-2014 : colon polyps, erosive esophagitis and gastritis,+ Barrett's--- f/u prn, no further surveillance scope,  rx PPIs low-dose/long-term  PLAN: HTN: BP today slightly elevated, at home SBP consistently between 111 and 115.  Diastolic BPs 48G.  Heart rate 70-80. Dyslipidemia: Diet controlled, check labs. Barrett's esophagus: On long-term PPIs, asymptomatic, per GI no no plans to do further surveillance. DJD: Doing great, uses a cane, no need for pain medication. Arrhythmia: EKG showing left bundle branch but also frequent ectopy.  Will check a magnesium level in addition to other labs.  Refer to cardiology.  Further eval? Preventive care reviewed RTC 6 months

## 2018-09-03 NOTE — Assessment & Plan Note (Addendum)
-   Td: 2014 - PNM 23 2007 and 2019 - prevnar-- 2013 - had a  Zostavax 2010 - shingrix RX printed - flu shot recommended  -- DEXA 11-2006 normal --[facility]: Cscope 08-2007 had a tubular adenoma  [facility]--2014--Dr Stark--adenomatous polyps  Coloscopy 06/2014, polyps, no further screening colonoscopy  --Prostate cancer screening: + FH prostate ca;  declined  DRE , check a PSA

## 2018-09-03 NOTE — Progress Notes (Signed)
Pre visit review using our clinic review tool, if applicable. No additional management support is needed unless otherwise documented below in the visit note. 

## 2018-09-04 LAB — COMPREHENSIVE METABOLIC PANEL
ALT: 28 U/L (ref 0–53)
AST: 23 U/L (ref 0–37)
Albumin: 4.7 g/dL (ref 3.5–5.2)
Alkaline Phosphatase: 58 U/L (ref 39–117)
BUN: 13 mg/dL (ref 6–23)
CO2: 31 mEq/L (ref 19–32)
Calcium: 10.2 mg/dL (ref 8.4–10.5)
Chloride: 95 mEq/L — ABNORMAL LOW (ref 96–112)
Creatinine, Ser: 0.97 mg/dL (ref 0.40–1.50)
GFR: 73.85 mL/min (ref >60.00)
Glucose, Bld: 106 mg/dL — ABNORMAL HIGH (ref 70–99)
Potassium: 4.5 mEq/L (ref 3.5–5.1)
Sodium: 136 mEq/L (ref 135–145)
Total Bilirubin: 0.8 mg/dL (ref 0.2–1.2)
Total Protein: 7.3 g/dL (ref 6.0–8.3)

## 2018-09-04 LAB — LIPID PANEL
Cholesterol: 197 mg/dL (ref 0–200)
HDL: 67.7 mg/dL (ref >39.00)
LDL Cholesterol: 112 mg/dL — ABNORMAL HIGH (ref 0–99)
NonHDL: 129.16
Total CHOL/HDL Ratio: 3
Triglycerides: 85 mg/dL (ref 0.0–149.0)
VLDL: 17 mg/dL (ref 0.0–40.0)

## 2018-09-04 LAB — TSH: TSH: 1.32 u[IU]/mL (ref 0.35–4.50)

## 2018-09-04 LAB — CBC WITH DIFFERENTIAL/PLATELET
Basophils Absolute: 0 10*3/uL (ref 0.0–0.1)
Basophils Relative: 0.7 % (ref 0.0–3.0)
Eosinophils Absolute: 0.1 10*3/uL (ref 0.0–0.7)
Eosinophils Relative: 1.5 % (ref 0.0–5.0)
HCT: 46.4 % (ref 39.0–52.0)
Hemoglobin: 15.9 g/dL (ref 13.0–17.0)
Lymphocytes Relative: 16.6 % (ref 12.0–46.0)
Lymphs Abs: 1.2 10*3/uL (ref 0.7–4.0)
MCHC: 34.3 g/dL (ref 30.0–36.0)
MCV: 98.5 fl (ref 78.0–100.0)
Monocytes Absolute: 0.8 10*3/uL (ref 0.1–1.0)
Monocytes Relative: 11.7 % (ref 3.0–12.0)
Neutro Abs: 5 10*3/uL (ref 1.4–7.7)
Neutrophils Relative %: 69.5 % (ref 43.0–77.0)
Platelets: 309 10*3/uL (ref 150.0–400.0)
RBC: 4.71 Mil/uL (ref 4.22–5.81)
RDW: 12.8 % (ref 11.5–15.5)
WBC: 7.1 10*3/uL (ref 4.0–10.5)

## 2018-09-04 LAB — PSA: PSA: 2.9 ng/mL (ref 0.10–4.00)

## 2018-09-04 LAB — MAGNESIUM: Magnesium: 2 mg/dL (ref 1.5–2.5)

## 2018-09-04 NOTE — Assessment & Plan Note (Signed)
HTN: BP today slightly elevated, at home SBP consistently between 111 and 115.  Diastolic BPs 29U.  Heart rate 70-80. Dyslipidemia: Diet controlled, check labs. Barrett's esophagus: On long-term PPIs, asymptomatic, per GI no no plans to do further surveillance. DJD: Doing great, uses a cane, no need for pain medication. Arrhythmia: EKG showing left bundle branch but also frequent ectopy.  Will check a magnesium level in addition to other labs.  Refer to cardiology.  Further eval? Preventive care reviewed RTC 6 months

## 2018-09-07 ENCOUNTER — Other Ambulatory Visit: Payer: Self-pay | Admitting: Internal Medicine

## 2018-09-17 ENCOUNTER — Other Ambulatory Visit: Payer: Self-pay | Admitting: Internal Medicine

## 2018-10-25 DIAGNOSIS — Z23 Encounter for immunization: Secondary | ICD-10-CM | POA: Diagnosis not present

## 2018-10-29 ENCOUNTER — Encounter: Payer: Self-pay | Admitting: Cardiology

## 2018-10-29 ENCOUNTER — Other Ambulatory Visit: Payer: Self-pay

## 2018-10-29 ENCOUNTER — Ambulatory Visit (INDEPENDENT_AMBULATORY_CARE_PROVIDER_SITE_OTHER): Payer: Medicare Other | Admitting: Cardiology

## 2018-10-29 ENCOUNTER — Ambulatory Visit: Payer: Medicare Other | Admitting: Cardiology

## 2018-10-29 VITALS — BP 138/62 | HR 58 | Wt 178.1 lb

## 2018-10-29 DIAGNOSIS — I10 Essential (primary) hypertension: Secondary | ICD-10-CM

## 2018-10-29 DIAGNOSIS — R011 Cardiac murmur, unspecified: Secondary | ICD-10-CM

## 2018-10-29 DIAGNOSIS — I447 Left bundle-branch block, unspecified: Secondary | ICD-10-CM

## 2018-10-29 HISTORY — DX: Left bundle-branch block, unspecified: I44.7

## 2018-10-29 NOTE — Addendum Note (Signed)
Addended by: Beckey Rutter on: 10/29/2018 02:40 PM   Modules accepted: Orders

## 2018-10-29 NOTE — Progress Notes (Signed)
Cardiology Office Note:    Date:  10/29/2018   ID:  Chase, Hernandez 07/20/1933, MRN [id number]  PCP:  Colon Branch, MD  Cardiologist:  Jenean Lindau, MD   Referring MD: Colon Branch, MD    ASSESSMENT:    1. Essential hypertension   2. Left bundle branch block (LBBB)    PLAN:    In order of problems listed above:  1. Essential hypertension: His blood pressure stable.  He is taking his medication regularly and he watches his diet carefully also.  His wife is very supportive.  I educated him about this issue. 2. Left bundle branch block: Patient is asymptomatic and has good effort tolerance.  Echocardiogram will be done to assess murmur heard on auscultation.  I also reviewed his lipids with him at length.  Patient has good effort tolerance. 3. Patient will be seen in follow-up appointment in 6 months or earlier if the patient has any concerns    Medication Adjustments/Labs and Tests Ordered: Current medicines are reviewed at length with the patient today.  Concerns regarding medicines are outlined above.  No orders of the defined types were placed in this encounter.  No orders of the defined types were placed in this encounter.    History of Present Illness:    Chase Hernandez is a 85 y.o. male who is being seen today for the evaluation of essential hypertension and newly diagnosed left bundle branch block at the request of Colon Branch, MD.  Patient is a pleasant 85 year old male and he has past medical history of essential hypertension.  He is in good health in good shape.  He is an active gentleman.  He that his doctor sent him here because his EKG was found to be abnormal.  He denies any chest pain orthopnea or PND with his activity.  At the time of my evaluation, the patient is alert awake oriented and in no distress.  Past Medical History:  Diagnosis Date  . Anemia   . Barrett's esophagus   . Colon polyps 08/13/2012   tubular adenomas  . Hyperlipidemia   .  Hypertension   . Kidney stones 2012   h/o  . LBBB (left bundle branch block)   . Neck fracture [facility]) 04-2010   Dr Ronnald Ramp, conservative treatnment  . Osteoarthritis    severe hip-back pain, saw neurology, nerve conduction study 2/11 negative; pain was due to hip arthritis    Past Surgical History:  Procedure Laterality Date  . CATARACT EXTRACTION W/ INTRAOCULAR LENS  IMPLANT, BILATERAL  2013  . TONSILLECTOMY    . TOTAL HIP ARTHROPLASTY     right, surgery April 5,2011 @ [place]  10/2009   left    Current Medications: Current Meds  Medication Sig  . atenolol-chlorthalidone (TENORETIC) 50-25 MG tablet Take 0.5 tablets by mouth daily.  Marland Kitchen GLUCOSAMINE HCL PO Take by mouth daily.  . Multiple Vitamins-Minerals (ZINC PO) Take by mouth.    . pantoprazole (PROTONIX) 20 MG tablet Take 1 tablet (20 mg total) by mouth daily before breakfast.  . potassium chloride (K-DUR) 10 MEQ tablet Take 1 tablet (10 mEq total) by mouth daily.     Allergies:   Oxycodone   Social History   Socioeconomic History  . Marital status: Married    Spouse name: Not on file  . Number of children: 1  . Years of education: Not on file  . Highest education level: Not  on file  Occupational History  . 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 Financial resource strain: Not on file  . Food insecurity    Worry: Not on file    Inability: Not on file  . Transportation needs    Medical: Not on file    Non-medical: Not on file  Tobacco Use  . Smoking status: Never Smoker  . Smokeless tobacco: Never Used  Substance and Sexual Activity  . Alcohol use: Yes    Alcohol/week: 0.0 standard drinks    Comment: 6 oz red wine per night  . Drug use: No  . Sexual activity: Never  Lifestyle  . Physical activity    Days per week: Not on file    Minutes per session: Not on file  . Stress: Not on file   Relationships  . Social Herbalist on phone: Not on file    Gets together: Not on file    Attends religious service: Not on file    Active member of club or organization: Not on file    Attends meetings of clubs or organizations: Not on file    Relationship status: Not on file  Other Topics Concern  . 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 There is no history of Heart attack or Colon cancer.  ROS:   Please see the history of present illness.    All other systems reviewed and are negative.  EKGs/Labs/Other Studies Reviewed:    The following studies were reviewed today: EKG reveals sinus rhythm and left bundle branch block   Recent Labs: 09/03/2018: ALT 28; BUN 13; Creatinine, Ser 0.97; Hemoglobin 15.9; Magnesium 2.0; Platelets 309.0; Potassium 4.5; Sodium 136; TSH 1.32  Recent Lipid Panel    Component Value Date/Time   CHOL 197 09/03/2018 1419   TRIG 85.0 09/03/2018 1419   TRIG 76 12/21/2005 0937   HDL 67.70 09/03/2018 1419   CHOLHDL 3 09/03/2018 1419   VLDL 17.0 09/03/2018 1419   LDLCALC 112 (H) 09/03/2018 1419   LDLDIRECT 142.8 02/17/2010 1021    Physical Exam:    VS:  BP 138/62 (BP Location: Left Arm, Patient Position: Sitting, Cuff Size: Normal)   Pulse (!) 58   Wt 178 lb 1.3 oz (80.8 kg)   SpO2 96%   BMI 24.15 kg/m     Wt Readings from Last 3 Encounters:  10/29/18 178 lb 1.3 oz (80.8 kg)  09/03/18 179 lb (81.2 kg)  09/03/18 179 lb 8 oz (81.4 kg)     GEN: Patient is in no acute distress HEENT: Normal NECK: No JVD; No carotid bruits LYMPHATICS: No lymphadenopathy CARDIAC: S1 S2 regular, 2/6 systolic murmur at the  apex. RESPIRATORY:  Clear to auscultation without rales, wheezing or rhonchi  ABDOMEN: Soft, non-tender, non-distended MUSCULOSKELETAL:  No edema; No deformity  SKIN: Warm and dry NEUROLOGIC:  Alert and oriented x 3 PSYCHIATRIC:  Normal affect    Signed, Jenean Lindau, MD  10/29/2018 2:15 PM    [facility]

## 2018-10-29 NOTE — Patient Instructions (Signed)
Medication Instructions:  Your physician recommends that you continue on your current medications as directed. Please refer to the Current Medication list given to you today.  If you need a refill on your cardiac medications before your next appointment, please call your pharmacy.   Lab work: NONE If you have labs (blood work) drawn today and your tests are completely normal, you will receive your results only by: . MyChart Message (if you have MyChart) OR . A paper copy in the mail If you have any lab test that is abnormal or we need to change your treatment, we will call you to review the results.  Testing/Procedures: Your physician has requested that you have an echocardiogram. Echocardiography is a painless test that uses sound waves to create images of your heart. It provides your doctor with information about the size and shape of your heart and how well your heart's chambers and valves are working. This procedure takes approximately one hour. There are no restrictions for this procedure.    Follow-Up: At [facility], you and your health needs are our priority.  As part of our continuing mission to provide you with exceptional heart care, we have created designated Provider Care Teams.  These Care Teams include your primary Cardiologist (physician) and Advanced Practice Providers (APPs -  Physician Assistants and Nurse Practitioners) who all work together to provide you with the care you need, when you need it. You will need a follow up appointment in 6 months.    Any Other Special Instructions Will Be Listed Below  Echocardiogram An echocardiogram is a procedure that uses painless sound waves (ultrasound) to produce an image of the heart. Images from an echocardiogram can provide important information about:  Signs of coronary artery disease (CAD).  Aneurysm detection. An aneurysm is a weak or damaged part of an artery wall that bulges out from the normal force of blood pumping  through the body.  Heart size and shape. Changes in the size or shape of the heart can be associated with certain conditions, including heart failure, aneurysm, and CAD.  Heart muscle function.  Heart valve function.  Signs of a past heart attack.  Fluid buildup around the heart.  Thickening of the heart muscle.  A tumor or infectious growth around the heart valves. Tell a health care provider about:  Any allergies you have.  All medicines you are taking, including vitamins, herbs, eye drops, creams, and over-the-counter medicines.  Any blood disorders you have.  Any surgeries you have had.  Any medical conditions you have.  Whether you are pregnant or may be pregnant. What are the risks? Generally, this is a safe procedure. However, problems may occur, including:  Allergic reaction to dye (contrast) that may be used during the procedure. What happens before the procedure? No specific preparation is needed. You may eat and drink normally. What happens during the procedure?   An IV tube may be inserted into one of your veins.  You may receive contrast through this tube. A contrast is an injection that improves the quality of the pictures from your heart.  A gel will be applied to your chest.  A wand-like tool (transducer) will be moved over your chest. The gel will help to transmit the sound waves from the transducer.  The sound waves will harmlessly bounce off of your heart to allow the heart images to be captured in real-time motion. The images will be recorded on a computer. The procedure may vary among health   care providers and hospitals. What happens after the procedure?  You may return to your normal, everyday life, including diet, activities, and medicines, unless your health care provider tells you not to do that. Summary  An echocardiogram is a procedure that uses painless sound waves (ultrasound) to produce an image of the heart.  Images from an  echocardiogram can provide important information about the size and shape of your heart, heart muscle function, heart valve function, and fluid buildup around your heart.  You do not need to do anything to prepare before this procedure. You may eat and drink normally.  After the echocardiogram is completed, you may return to your normal, everyday life, unless your health care provider tells you not to do that. This information is not intended to replace advice given to you by your health care provider. Make sure you discuss any questions you have with your health care provider. Document Released: 01/21/2000 Document Revised: 05/16/2018 Document Reviewed: 02/26/2016 Elsevier Patient Education  2020 Elsevier Inc.  

## 2018-10-29 NOTE — Addendum Note (Signed)
Addended by: Beckey Rutter on: 10/29/2018 02:35 PM   Modules accepted: Orders

## 2018-11-05 ENCOUNTER — Other Ambulatory Visit: Payer: Self-pay

## 2018-11-05 ENCOUNTER — Ambulatory Visit (HOSPITAL_BASED_OUTPATIENT_CLINIC_OR_DEPARTMENT_OTHER)
Admission: RE | Admit: 2018-11-05 | Discharge: 2018-11-05 | Disposition: A | Discharge status: Home / Self Care | Payer: Medicare Other | Source: Ambulatory Visit | Attending: Cardiology | Admitting: Cardiology

## 2018-11-05 DIAGNOSIS — R011 Cardiac murmur, unspecified: Secondary | ICD-10-CM | POA: Diagnosis not present

## 2018-11-05 NOTE — Progress Notes (Signed)
  Echocardiogram 2D Echocardiogram has been performed.  Chase Hernandez 11/05/2018, 1:39 PM

## 2018-11-07 ENCOUNTER — Telehealth: Payer: Self-pay

## 2018-11-07 ENCOUNTER — Other Ambulatory Visit: Payer: Self-pay | Admitting: Internal Medicine

## 2018-11-07 NOTE — Telephone Encounter (Signed)
Results relayed, copy sent to Dr. Larose Kells per Dr. Docia Furl.

## 2018-11-07 NOTE — Telephone Encounter (Signed)
-----   Message from Jenean Lindau, MD sent at 11/06/2018  2:37 PM EDT ----- The results of the study is unremarkable. Please inform patient. I will discuss in detail at next appointment. Cc  primary care/referring physician Jenean Lindau, MD 11/06/2018 2:37 PM

## 2019-01-01 ENCOUNTER — Other Ambulatory Visit: Payer: Self-pay

## 2019-02-28 ENCOUNTER — Telehealth: Payer: Self-pay | Admitting: Internal Medicine

## 2019-02-28 MED ORDER — PANTOPRAZOLE SODIUM 20 MG PO TBEC: 20.0000 mg | DELAYED_RELEASE_TABLET | Freq: Every day | ORAL | 3 refills | Status: DC

## 2019-02-28 MED ORDER — ATENOLOL-CHLORTHALIDONE 50-25 MG PO TABS: 0.5000 | ORAL_TABLET | Freq: Every day | ORAL | 1 refills | Status: DC

## 2019-02-28 MED ORDER — POTASSIUM CHLORIDE ER 10 MEQ PO TBCR: 10.0000 meq | EXTENDED_RELEASE_TABLET | Freq: Every day | ORAL | 1 refills | Status: DC

## 2019-02-28 NOTE — Telephone Encounter (Signed)
If he is feeling well and his blood pressures are normal okay to postpone the visit for few months. Refill meds for 6 months if needed

## 2019-02-28 NOTE — Telephone Encounter (Signed)
Spoke w/ Pt's wife Mardene Celeste informed okay to postpone appt. Appt for 1/27 cancelled. Meds refilled.

## 2019-02-28 NOTE — Telephone Encounter (Signed)
Please advise 

## 2019-02-28 NOTE — Telephone Encounter (Signed)
Pt would like to know if his appt on 03/05/2019 is necessary? appt desk states that its a routine follow up (40months). PT states that he is perfectly fine. Patient like to be advise either or not Dr Larose Kells thinks he should come in.

## 2019-03-05 ENCOUNTER — Ambulatory Visit: Payer: Medicare Other | Admitting: Internal Medicine

## 2019-05-20 ENCOUNTER — Ambulatory Visit (INDEPENDENT_AMBULATORY_CARE_PROVIDER_SITE_OTHER): Payer: Medicare Other | Admitting: Internal Medicine

## 2019-05-20 ENCOUNTER — Encounter: Payer: Self-pay | Admitting: Internal Medicine

## 2019-05-20 ENCOUNTER — Other Ambulatory Visit: Payer: Self-pay

## 2019-05-20 VITALS — BP 155/73 | HR 77 | Temp 96.8°F | Resp 16 | Ht 72.0 in | Wt 179.0 lb

## 2019-05-20 DIAGNOSIS — I447 Left bundle-branch block, unspecified: Secondary | ICD-10-CM | POA: Diagnosis not present

## 2019-05-20 DIAGNOSIS — I10 Essential (primary) hypertension: Secondary | ICD-10-CM | POA: Diagnosis not present

## 2019-05-20 DIAGNOSIS — E785 Hyperlipidemia, unspecified: Secondary | ICD-10-CM | POA: Diagnosis not present

## 2019-05-20 LAB — CBC WITH DIFFERENTIAL/PLATELET
Basophils Absolute: 0.1 10*3/uL (ref 0.0–0.1)
Basophils Relative: 0.8 % (ref 0.0–3.0)
Eosinophils Absolute: 0.2 10*3/uL (ref 0.0–0.7)
Eosinophils Relative: 2.4 % (ref 0.0–5.0)
HCT: 44.9 % (ref 39.0–52.0)
Hemoglobin: 15.5 g/dL (ref 13.0–17.0)
Lymphocytes Relative: 22 % (ref 12.0–46.0)
Lymphs Abs: 1.5 10*3/uL (ref 0.7–4.0)
MCHC: 34.6 g/dL (ref 30.0–36.0)
MCV: 97.6 fl (ref 78.0–100.0)
Monocytes Absolute: 0.9 10*3/uL (ref 0.1–1.0)
Monocytes Relative: 13.4 % — ABNORMAL HIGH (ref 3.0–12.0)
Neutro Abs: 4.1 10*3/uL (ref 1.4–7.7)
Neutrophils Relative %: 61.4 % (ref 43.0–77.0)
Platelets: 292 10*3/uL (ref 150.0–400.0)
RBC: 4.6 Mil/uL (ref 4.22–5.81)
RDW: 13.3 % (ref 11.5–15.5)
WBC: 6.7 10*3/uL (ref 4.0–10.5)

## 2019-05-20 LAB — BASIC METABOLIC PANEL
BUN: 14 mg/dL (ref 6–23)
CO2: 33 mEq/L — ABNORMAL HIGH (ref 19–32)
Calcium: 9.9 mg/dL (ref 8.4–10.5)
Chloride: 95 mEq/L — ABNORMAL LOW (ref 96–112)
Creatinine, Ser: 0.87 mg/dL (ref 0.40–1.50)
GFR: 83.59 mL/min (ref >60.00)
Glucose, Bld: 90 mg/dL (ref 70–99)
Potassium: 4.3 mEq/L (ref 3.5–5.1)
Sodium: 135 mEq/L (ref 135–145)

## 2019-05-20 NOTE — Progress Notes (Signed)
Subjective:    Patient ID: Chase Hernandez, male    DOB: 1933-07-28, 86 y.o.   MRN: [id number]  DOS:  05/20/2019 Type of visit - description: Follow-up Today we talk about high blood pressure, mild hyperlipidemia and his visit with cardiology.   Review of Systems Currently feeling well. Eating healthy He remains extremely active lifting weights at home. Specifically denies chest pain, difficulty breathing. No palpitations, no lower extremity edema No dysphagia or odynophagia  Past Medical History:  Diagnosis Date  . Anemia   . Barrett's esophagus   . Colon polyps 08/13/2012   tubular adenomas  . Hyperlipidemia   . Hypertension   . Kidney stones 2012   h/o  . LBBB (left bundle branch block)   . Neck fracture [facility]) 04-2010   Dr Ronnald Ramp, conservative treatnment  . Osteoarthritis    severe hip-back pain, saw neurology, nerve conduction study 2/11 negative; pain was due to hip arthritis    Past Surgical History:  Procedure Laterality Date  . CATARACT EXTRACTION W/ INTRAOCULAR LENS  IMPLANT, BILATERAL  2013  . TONSILLECTOMY    . TOTAL HIP ARTHROPLASTY     right, surgery April 5,2011 @ [place]  10/2009   left    Allergies as of 05/20/2019      Reactions   Oxycodone    Other reaction(s): Other (See Comments) Other Reaction: fecal impaction      Medication List       Accurate as of May 20, 2019  2:04 PM. If you have any questions, ask your nurse or doctor.        atenolol-chlorthalidone 50-25 MG tablet Commonly known as: TENORETIC Take 0.5 tablets by mouth daily.   GLUCOSAMINE HCL PO Take by mouth daily.   pantoprazole 20 MG tablet Commonly known as: PROTONIX Take 1 tablet (20 mg total) by mouth daily before breakfast.   potassium chloride 10 MEQ tablet Commonly known as: KLOR-CON Take 1 tablet (10 mEq total) by mouth daily.   ZINC PO Take by mouth.          Objective:   Physical Exam BP (!) 155/73 (BP Location:  Left Arm, Patient Position: Sitting, Cuff Size: Small)   Pulse 77   Temp (!) 96.8 F (36 C) (Temporal)   Resp 16   Ht 6' (1.829 m)   Wt 179 lb (81.2 kg)   SpO2 92%   BMI 24.28 kg/m   General:   Well developed, NAD, BMI noted. HEENT:  Normocephalic . Face symmetric, atraumatic Lungs:  CTA B Normal respiratory effort, no intercostal retractions, no accessory muscle use. Heart: Frequent irregularity noted,  no murmur.  Lower extremities: no pretibial edema bilaterally  Skin: Not pale. Not jaundice Neurologic:  alert & oriented X3.  Speech normal, gait appropriate for age assisted by a cane Psych--  Cognition and judgment appear intact.  Cooperative with normal attention span and concentration.  Behavior appropriate. No anxious or depressed appearing.      Assessment     Assessment HTN Hyperlipidemia DJD: See surgeries Neck fracture 2012, Dr. Ronnald Ramp, conservative treatment H/o Kidney stones 2012 Barrett's esophagus H/o anemia: cscope , EGD done 06-2014 : colon polyps, erosive esophagitis and gastritis,+ Barrett's--- f/u prn, no further surveillance scope,  rx PPIs low-dose/long-term  PLAN: HTN: Currently on Tenoretic, potassium supplements.  BP today slightly elevated but at home is excellent, he brought a log and it runs from 108/74-118/69.  Check a BMP, CBC, no change.  Hyperlipidemia: Doing great with diet and exercise. LBBB, frequent ectopy: Seen by cardiology 10-2018, note reviewed, he was felt to be stable.  Echocardiogram was unremarkable.  Patient wonders if he needs to go back to see him (office visits stress him severely and he would go back only if strictly necessary).  We agreed that as long as he remains asymptomatic we will hold off on cardiology follow-up. Preventive care: S/p Covid shot x2 RTC 6 months    This visit occurred during the SARS-CoV-2 public health emergency.  Safety protocols were in place, including screening questions prior to the visit,  additional usage of staff PPE, and extensive cleaning of exam room while observing appropriate contact time as indicated for disinfecting solutions.

## 2019-05-20 NOTE — Patient Instructions (Signed)
Continue checking your blood pressures  BP GOAL is between 110/65 and  135/85. If it is consistently higher or lower, let me know   GO TO THE LAB : Get the blood work     Chase Hernandez, [place] back for   4 checkup in 6 months

## 2019-05-20 NOTE — Progress Notes (Signed)
Pre visit review using our clinic review tool, if applicable. No additional management support is needed unless otherwise documented below in the visit note. 

## 2019-05-21 NOTE — Assessment & Plan Note (Signed)
HTN: Currently on Tenoretic, potassium supplements.  BP today slightly elevated but at home is excellent, he brought a log and it runs from 108/74-118/69.  Check a BMP, CBC, no change. Hyperlipidemia: Doing great with diet and exercise. LBBB, frequent ectopy: Seen by cardiology 10-2018, note reviewed, he was felt to be stable.  Echocardiogram was unremarkable.  Patient wonders if he needs to go back to see him (office visits stress him severely and he would go back only if strictly necessary).  We agreed that as long as he remains asymptomatic we will hold off on cardiology follow-up. Preventive care: S/p Covid shot x2 RTC 6 months

## 2019-09-13 ENCOUNTER — Other Ambulatory Visit: Payer: Self-pay | Admitting: Internal Medicine

## 2019-10-10 NOTE — Progress Notes (Signed)
I connected with Chase Hernandez today by telephone and verified that I am speaking with the correct person using two identifiers. Location patient: home Location provider: work Persons participating in the virtual visit: patient, Marine scientist.    I discussed the limitations, risks, security and privacy concerns of performing an evaluation and management service by telephone and the availability of in person appointments. I also discussed with the patient that there may be a patient responsible charge related to this service. The patient expressed understanding and verbally consented to this telephonic visit.    Interactive audio and video telecommunications were attempted between this provider and patient, however failed, due to patient having technical difficulties OR patient did not have access to video capability.  We continued and completed visit with audio only.  Some vital signs may be absent or patient reported.    Subjective:   Chase Hernandez is a 86 y.o. male who presents for Medicare Annual/Subsequent preventive examination.  Review of Systems     Cardiac Risk Factors include: advanced age (>49men, >56 women);dyslipidemia;hypertension;male gender     Objective:     Advanced Directives 10/14/2019 09/03/2018 06/26/2017 06/23/2016 06/23/2014  Does Patient Have a Medical Advance Directive? Yes Yes Yes Yes No  Type of Paramedic of [place];Living will [place];Living will [place];Living will [place];Living will -  Does patient want to make changes to medical advance directive? No - Patient declined No - Patient declined No - Patient declined No - Patient declined -  Copy of [place] in Chart? No - copy requested No - copy requested No - copy requested No - copy requested -  Would patient like information on creating a medical advance directive? - - - - No - patient declined information     Current Medications (verified) Outpatient Encounter Medications as of 10/14/2019  Medication Sig  . atenolol-chlorthalidone (TENORETIC) 50-25 MG tablet Take 0.5 tablets by mouth daily.  Marland Kitchen GLUCOSAMINE HCL PO Take by mouth daily.  . Multiple Vitamins-Minerals (ZINC PO) Take by mouth.    . pantoprazole (PROTONIX) 20 MG tablet Take 1 tablet (20 mg total) by mouth daily before breakfast.  . potassium chloride (KLOR-CON) 10 MEQ tablet TAKE 1 TABLET BY MOUTH EVERY DAY   No facility-administered encounter medications on file as of 10/14/2019.    Allergies (verified) Oxycodone   History: Past Medical History:  Diagnosis Date  . Anemia   . Barrett's esophagus   . Colon polyps 08/13/2012   tubular adenomas  . Hyperlipidemia   . Hypertension   . Kidney stones 2012   h/o  . LBBB (left bundle branch block)   . Neck fracture [facility]) 04-2010   Dr Ronnald Ramp, conservative treatnment  . Osteoarthritis    severe hip-back pain, saw neurology, nerve conduction study 2/11 negative; pain was due to hip arthritis   Past Surgical History:  Procedure Laterality Date  . CATARACT EXTRACTION W/ INTRAOCULAR LENS  IMPLANT, BILATERAL  2013  . TONSILLECTOMY    . TOTAL HIP ARTHROPLASTY     right, surgery April 5,2011 @ [place]  10/2009   left   Family History  Problem Relation Age of Onset  . Breast cancer Mother   . Diabetes Other        GM  . Prostate cancer Father 83  . Heart attack Neg Hx   . Colon cancer Neg Hx    Social History  Socioeconomic History  . Marital status: Married    Spouse name: Not on file  . Number of children: 1  . Years of education: Not on file  . Highest education level: Not on file  Occupational History  . 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 Smoking status: Never Smoker  . Smokeless tobacco: Never Used  Substance and Sexual Activity  . Alcohol use:  Yes    Alcohol/week: 0.0 standard drinks    Comment: 6 oz red wine per night  . Drug use: No  . Sexual activity: Never  Other Topics Concern  . 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 Difficulty of Paying Living Expenses: Not hard at all  Food Insecurity: No Food Insecurity  . Worried About Charity fundraiser in the Last Year: Never true  . Ran Out of Food in the Last Year: Never true  Transportation Needs: No Transportation Needs  . Lack of Transportation (Medical): No  . Lack of Transportation (Non-Medical): No  Physical Activity:   . Days of Exercise per Week: Not on file  . Minutes of Exercise per Session: Not on file  Stress:   . Feeling of Stress : Not on file  Social Connections:   . Frequency of Communication with Friends and Family: Not on file  . Frequency of Social Gatherings with Friends and Family: Not on file  . Attends Religious Services: Not on file  . Active Member of Clubs or Organizations: Not on file  . Attends Archivist Meetings: Not on file  . Marital Status: Not on file    Tobacco Counseling Counseling given: Not Answered   Clinical Intake: Pain : No/denies pain    Activities of Daily Living In your present state of health, do you have any difficulty performing the following activities: 10/14/2019  Hearing? N  Vision? N  Difficulty concentrating or making decisions? N  Walking or climbing stairs? Y  Dressing or bathing? N  Doing errands, shopping? Y  Preparing Food and eating ? N  Using the Toilet? N  In the past six months, have you accidently leaked urine? N  Do you have problems with loss of bowel control? N  Managing your Medications? N  Managing your Finances? N  Housekeeping or managing your  Housekeeping? N  Some recent data might be hidden    Patient Care Team: Colon Branch, MD as PCP - General Ladene Artist, MD as Consulting Physician (Gastroenterology)  Indicate any recent Medical Services you may have received from other than [facility] providers in the past year (date may be approximate).     Assessment:   This is a routine wellness examination for Chase Hernandez.  Dietary issues and exercise activities discussed: Current Exercise Habits: Home exercise routine, Type of exercise: strength training/weights;walking;calisthenics, Time (Minutes): 30, Frequency (Times/Week): 5, Weekly Exercise (Minutes/Week): 150, Intensity: Mild, Exercise limited by: None identified Diet (meal preparation, eat out, water intake, caffeinated beverages, dairy products, fruits and vegetables): in general, a "healthy" diet  , on average, 3 meals per day   Goals    .  Maintain current healthy lifestyle (pt-stated)  Depression Screen PHQ 2/9 Scores 10/14/2019 09/03/2018 09/03/2018 06/26/2017 06/26/2017 06/23/2016 06/22/2015  PHQ - 2 Score 0 0 0 0 0 0 0    Fall Risk Fall Risk  10/14/2019 01/01/2019 09/03/2018 09/03/2018 06/26/2017  Falls in the past year? 0 0 0 0 No  Comment - Emmi Telephone Survey: data to providers prior to load - - -  Number falls in past yr: 0 - - - -  Injury with Fall? 0 - - - -  Follow up Education provided;Falls prevention discussed - - - -    Any stairs in or around the home? No  If so, are there any without handrails? No  Home free of loose throw rugs in walkways, pet beds, electrical cords, etc? Yes  Adequate lighting in your home to reduce risk of falls? Yes   ASSISTIVE DEVICES UTILIZED TO PREVENT FALLS:  Life alert? No  Use of a cane, walker or w/c? Yes  Grab bars in the bathroom? Yes  Shower chair or bench in shower? Yes  Elevated toilet seat or a handicapped toilet? Yes     Cognitive Function: Ad8 score reviewed for issues:  Issues making decisions:no  Less  interest in hobbies / activities:no  Repeats questions, stories (family complaining):no  Trouble using ordinary gadgets (microwave, computer, phone):no  Forgets the month or year: no  Mismanaging finances: no  Remembering appts:no Daily problems with thinking and/or memory:no Ad8 score is=0     MMSE - Mini Mental State Exam 06/26/2017  Orientation to time 5  Orientation to Place 5  Registration 3  Attention/ Calculation 5  Recall 2  Language- name 2 objects 2  Language- repeat 1  Language- follow 3 step command 3  Language- read & follow direction 1  Write a sentence 1  Copy design 1  Total score 29        Immunizations Immunization History  Administered Date(s) Administered  . Influenza Split 11/14/2017  . Influenza Whole 11/23/2006, 02/08/2009, 12/03/2011  . Influenza, High Dose Seasonal PF 10/22/2018  . Influenza-Unspecified 12/26/2013, 12/08/2014, 12/06/2016  . PFIZER SARS-COV-2 Vaccination 04/17/2019, 05/08/2019  . Pneumococcal Conjugate-13 05/23/2013  . Pneumococcal Polysaccharide-23 12/07/2005, 06/26/2017  . Td 04/09/2002  . Tdap 05/09/2012  . Zoster 04/01/2008  . Zoster Recombinat (Shingrix) 09/06/2018, 03/14/2019    TDAP status: Up to date Flu Vaccine status: Up to date Pneumococcal vaccine status: Up to date Covid-19 vaccine status: Completed vaccines  Qualifies for Shingles Vaccine? Yes   Zostavax completed Yes   Shingrix Completed?: Yes  Screening Tests Health Maintenance  Topic Date Due  . INFLUENZA VACCINE  09/07/2019  . TETANUS/TDAP  05/10/2022  . COVID-19 Vaccine  Completed  . PNA vac Low Risk Adult  Completed    Health Maintenance  Health Maintenance Due  Topic Date Due  . INFLUENZA VACCINE  09/07/2019    Colorectal cancer screening: No longer required.   Lung Cancer Screening: (Low Dose CT Chest recommended if Age 9-80 years, 30 pack-year currently smoking OR have quit w/in 15years.) does not qualify.     Additional  Screening:  Vision Screening: Recommended annual ophthalmology exams for early detection of glaucoma and other disorders of the eye. Is the patient up to date with their annual eye exam?  Yes    Dental Screening: Recommended annual dental exams for proper oral hygiene  Community Resource Referral / Chronic Care Management: CRR required this visit?  No   CCM required this visit?  No      Plan:  Continue to eat heart healthy diet (full of fruits, vegetables, whole grains, lean protein, water--limit salt, fat, and sugar intake) and increase physical activity as tolerated.  Continue doing brain stimulating activities (puzzles, reading, adult coloring books, staying active) to keep memory sharp.   Bring a copy of your living will and/or healthcare power of attorney to your next office visit.   I have personally reviewed and noted the following in the patient's chart:   . Medical and social history . Use of alcohol, tobacco or illicit drugs  . Current medications and supplements . Functional ability and status . Nutritional status . Physical activity . Advanced directives . List of other physicians . Hospitalizations, surgeries, and ER visits in previous 12 months . Vitals . Screenings to include cognitive, depression, and falls . Referrals and appointments  In addition, I have reviewed and discussed with patient certain preventive protocols, quality metrics, and best practice recommendations. A written personalized care plan for preventive services as well as general preventive health recommendations were provided to patient.   Due to this being a telephonic visit, the after visit summary with patients personalized plan was offered to patient via mail or my-chart. Patient would like to access on my-chart.   Shela Nevin, [place]   10/14/2019

## 2019-10-14 ENCOUNTER — Ambulatory Visit (INDEPENDENT_AMBULATORY_CARE_PROVIDER_SITE_OTHER): Payer: Medicare Other | Admitting: *Deleted

## 2019-10-14 ENCOUNTER — Encounter: Payer: Self-pay | Admitting: *Deleted

## 2019-10-14 DIAGNOSIS — Z0000 Encounter for general adult medical examination without abnormal findings: Secondary | ICD-10-CM | POA: Diagnosis not present

## 2019-10-14 NOTE — Patient Instructions (Signed)
Continue to eat heart healthy diet (full of fruits, vegetables, whole grains, lean protein, water--limit salt, fat, and sugar intake  Continue doing brain stimulating activities (puzzles, reading, adult coloring books, staying active) to keep memory sharp.   Bring a copy of your living will and/or healthcare power of attorney to your next office visit.   Chase Hernandez , Thank you for taking time to come for your Medicare Wellness Visit. I appreciate your ongoing commitment to your health goals. Please review the following plan we discussed and let me know if I can assist you in the future.   These are the goals we discussed: Goals      Maintain current healthy lifestyle (pt-stated)       This is a list of the screening recommended for you and due dates:  Health Maintenance  Topic Date Due   Flu Shot  09/07/2019   Tetanus Vaccine  05/10/2022   COVID-19 Vaccine  Completed   Pneumonia vaccines  Completed    Preventive Care 65 Years and Older, Male Preventive care refers to lifestyle choices and visits with your health care provider that can promote health and wellness. This includes:  A yearly physical exam. This is also called an annual well check.  Regular dental and eye exams.  Immunizations.  Screening for certain conditions.  Healthy lifestyle choices, such as diet and exercise. What can I expect for my preventive care visit? Physical exam Your health care provider will check:  Height and weight. These may be used to calculate body mass index (BMI), which is a measurement that tells if you are at a healthy weight.  Heart rate and blood pressure.  Your skin for abnormal spots. Counseling Your health care provider may ask you questions about:  Alcohol, tobacco, and drug use.  Emotional well-being.  Home and relationship well-being.  Sexual activity.  Eating habits.  History of falls.  Memory and ability to understand (cognition).  Work and work  Astronomer. What immunizations do I need?  Influenza (flu) vaccine  This is recommended every year. Tetanus, diphtheria, and pertussis (Tdap) vaccine  You may need a Td booster every 10 years. Varicella (chickenpox) vaccine  You may need this vaccine if you have not already been vaccinated. Zoster (shingles) vaccine  You may need this after age 46. Pneumococcal conjugate (PCV13) vaccine  One dose is recommended after age 42. Pneumococcal polysaccharide (PPSV23) vaccine  One dose is recommended after age 21. Measles, mumps, and rubella (MMR) vaccine  You may need at least one dose of MMR if you were born in 1957 or later. You may also need a second dose. Meningococcal conjugate (MenACWY) vaccine  You may need this if you have certain conditions. Hepatitis A vaccine  You may need this if you have certain conditions or if you travel or work in places where you may be exposed to hepatitis A. Hepatitis B vaccine  You may need this if you have certain conditions or if you travel or work in places where you may be exposed to hepatitis B. Haemophilus influenzae type b (Hib) vaccine  You may need this if you have certain conditions. You may receive vaccines as individual doses or as more than one vaccine together in one shot (combination vaccines). Talk with your health care provider about the risks and benefits of combination vaccines. What tests do I need? Blood tests  Lipid and cholesterol levels. These may be checked every 5 years, or more frequently depending on your overall health.  Hepatitis C test.  Hepatitis B test. Screening  Lung cancer screening. You may have this screening every year starting at age 87 if you have a 30-pack-year history of smoking and currently smoke or have quit within the past 15 years.  Colorectal cancer screening. All adults should have this screening starting at age 81 and continuing until age 49. Your health care provider may recommend  screening at age 27 if you are at increased risk. You will have tests every 1-10 years, depending on your results and the type of screening test.  Prostate cancer screening. Recommendations will vary depending on your family history and other risks.  Diabetes screening. This is done by checking your blood sugar (glucose) after you have not eaten for a while (fasting). You may have this done every 1-3 years.  Abdominal aortic aneurysm (AAA) screening. You may need this if you are a current or former smoker.  Sexually transmitted disease (STD) testing. Follow these instructions at home: Eating and drinking  Eat a diet that includes fresh fruits and vegetables, whole grains, lean protein, and low-fat dairy products. Limit your intake of foods with high amounts of sugar, saturated fats, and salt.  Take vitamin and mineral supplements as recommended by your health care provider.  Do not drink alcohol if your health care provider tells you not to drink.  If you drink alcohol: ? Limit how much you have to 0-2 drinks a day. ? Be aware of how much alcohol is in your drink. In the [place] one drink equals one 12 oz bottle of beer (355 mL), one 5 oz glass of wine (148 mL), or one 1 oz glass of hard liquor (44 mL). Lifestyle  Take daily care of your teeth and gums.  Stay active. Exercise for at least 30 minutes on 5 or more days each week.  Do not use any products that contain nicotine or tobacco, such as cigarettes, e-cigarettes, and chewing tobacco. If you need help quitting, ask your health care provider.  If you are sexually active, practice safe sex. Use a condom or other form of protection to prevent STIs (sexually transmitted infections).  Talk with your health care provider about taking a low-dose aspirin or statin. What's next?  Visit your health care provider once a year for a well check visit.  Ask your health care provider how often you should have your eyes and teeth  checked.  Stay up to date on all vaccines. This information is not intended to replace advice given to you by your health care provider. Make sure you discuss any questions you have with your health care provider. Document Revised: 01/17/2018 Document Reviewed: 01/17/2018 Elsevier Patient Education  2020 [employer].

## 2019-10-15 ENCOUNTER — Telehealth: Payer: Self-pay | Admitting: Internal Medicine

## 2019-10-15 NOTE — Telephone Encounter (Signed)
Pt dropped of parking placecard form for Paz to sign.  Put in [place] folder at front desk.   I told him we would call when it is done. He can come pick up. ( he did leave an envelope with stamp on just in case)

## 2019-10-15 NOTE — Telephone Encounter (Signed)
Form completed, spoke w/ Pt- he is aware I am mailing it back to him. Copy sent for scanning.

## 2019-11-19 ENCOUNTER — Other Ambulatory Visit: Payer: Self-pay

## 2019-11-19 ENCOUNTER — Encounter: Payer: Self-pay | Admitting: Internal Medicine

## 2019-11-19 ENCOUNTER — Ambulatory Visit (INDEPENDENT_AMBULATORY_CARE_PROVIDER_SITE_OTHER): Payer: Medicare Other | Admitting: Internal Medicine

## 2019-11-19 VITALS — BP 162/81 | HR 72 | Temp 97.7°F | Resp 18 | Ht 72.0 in | Wt 181.2 lb

## 2019-11-19 DIAGNOSIS — I10 Essential (primary) hypertension: Secondary | ICD-10-CM

## 2019-11-19 DIAGNOSIS — K2270 Barrett's esophagus without dysplasia: Secondary | ICD-10-CM | POA: Diagnosis not present

## 2019-11-19 DIAGNOSIS — Z0000 Encounter for general adult medical examination without abnormal findings: Secondary | ICD-10-CM

## 2019-11-19 DIAGNOSIS — E785 Hyperlipidemia, unspecified: Secondary | ICD-10-CM | POA: Diagnosis not present

## 2019-11-19 DIAGNOSIS — Z23 Encounter for immunization: Secondary | ICD-10-CM | POA: Diagnosis not present

## 2019-11-19 NOTE — Assessment & Plan Note (Addendum)
-   Td: 2014 - PNM 23: 2007 and 2019 - prevnar: 2013 - had a  Zostavax 2010 - shingrix: Completed - flu shot: Today - Had 2 Covid vaccinations, plans to get a booster soon -- DEXA 11-2006 normal --CCS: Cscope 08-2007 had a tubular adenoma  CCS--2014--Dr Stark--adenomatous polyps  Coloscopy 06/2014, polyps, no further screening colonoscopy  --Prostate cancer screening: + FH prostate ca; pros/cons of further screening discussed, elected no further testing. -Advance care planning: Patient provided his POA and living will today, will be scanned.

## 2019-11-19 NOTE — Progress Notes (Signed)
Pre visit review using our clinic review tool, if applicable. No additional management support is needed unless otherwise documented below in the visit note. 

## 2019-11-19 NOTE — Progress Notes (Signed)
Subjective:    Patient ID: Chase Hernandez, male    DOB: 09/08/1933, 86 y.o.   MRN: [id number]  DOS:  11/19/2019 Type of visit - description: f/u  Since the last office visit he is doing well. Good compliance with medication. I review his ambulatory BPs. He remains active and is trying to eat healthy.  Review of Systems See above   Past Medical History:  Diagnosis Date  . Anemia   . Barrett's esophagus   . Colon polyps 08/13/2012   tubular adenomas  . Hyperlipidemia   . Hypertension   . Kidney stones 2012   h/o  . LBBB (left bundle branch block)   . Neck fracture [facility]) 04-2010   Dr Ronnald Ramp, conservative treatnment  . Osteoarthritis    severe hip-back pain, saw neurology, nerve conduction study 2/11 negative; pain was due to hip arthritis    Past Surgical History:  Procedure Laterality Date  . CATARACT EXTRACTION W/ INTRAOCULAR LENS  IMPLANT, BILATERAL  2013  . TONSILLECTOMY    . TOTAL HIP ARTHROPLASTY     right, surgery April 5,2011 @ [place]  10/2009   left    Allergies as of 11/19/2019      Reactions   Oxycodone    Other reaction(s): Other (See Comments) Other Reaction: fecal impaction      Medication List       Accurate as of November 19, 2019 11:59 PM. If you have any questions, ask your nurse or doctor.        atenolol-chlorthalidone 50-25 MG tablet Commonly known as: TENORETIC Take 0.5 tablets by mouth daily.   GLUCOSAMINE HCL PO Take by mouth daily.   pantoprazole 20 MG tablet Commonly known as: PROTONIX Take 1 tablet (20 mg total) by mouth daily before breakfast.   potassium chloride 10 MEQ tablet Commonly known as: KLOR-CON TAKE 1 TABLET BY MOUTH EVERY DAY   ZINC PO Take by mouth.          Objective:   Physical Exam BP (!) 162/81 (BP Location: Left Arm, Patient Position: Sitting, Cuff Size: Small)   Pulse 72   Temp 97.7 F (36.5 C) (Oral)   Resp 18   Ht 6' (1.829 m)   Wt 181 lb 4 oz (82.2 kg)    SpO2 98%   BMI 24.58 kg/m  General: Well developed, NAD, BMI noted Neck: No  thyromegaly  HEENT:  Normocephalic . Face symmetric, atraumatic Lungs:  CTA B Normal respiratory effort, no intercostal retractions, no accessory muscle use. Heart: RRR,  no murmur.  Abdomen:  Not distended, soft, non-tender. No rebound or rigidity.   Lower extremities: no pretibial edema bilaterally  Skin: Exposed areas without rash. Not pale. Not jaundice Neurologic:  alert & oriented X3.  Speech normal, gait assisted by a cane, needs some help transferring. Psych: Cognition and judgment appear intact.  Cooperative with normal attention span and concentration.  Behavior appropriate. No anxious or depressed appearing.     Assessment     Assessment HTN Hyperlipidemia DJD: See surgeries Neck fracture 2012, Dr. Ronnald Ramp, conservative treatment H/o Kidney stones 2012 Barrett's esophagus H/o anemia: cscope , EGD done 06-2014 : colon polyps, erosive esophagitis and gastritis,+ Barrett's--- f/u prn, no further surveillance scope,  rx PPIs low-dose/long-term  PLAN: HTN: BP today is a slightly elevated, at home is consistently in the mid 110s / 60 with a heart rate of 80.  Continue Tenoretic, potassium, check daily CMP High cholesterol:  Diet controlled, check FLP DJD: Seems to be doing well with no need for pain medication, uses a cane, is trying to remain active. History of Barrett's: On PPIs, last CBC normal. Preventive care reviewed RTC 6 months   This visit occurred during the SARS-CoV-2 public health emergency.  Safety protocols were in place, including screening questions prior to the visit, additional usage of staff PPE, and extensive cleaning of exam room while observing appropriate contact time as indicated for disinfecting solutions.

## 2019-11-19 NOTE — Patient Instructions (Signed)
Continue checking your blood pressures BP GOAL is between 110/65 and  135/85. If it is consistently higher or lower, let me know   GO TO THE LAB : Get the blood work     [place], [place] back for a checkup in 6 months

## 2019-11-20 LAB — COMPREHENSIVE METABOLIC PANEL
AG Ratio: 1.7 (calc) (ref 1.0–2.5)
ALT: 26 U/L (ref 9–46)
AST: 20 U/L (ref 10–35)
Albumin: 4.7 g/dL (ref 3.6–5.1)
Alkaline phosphatase (APISO): 64 U/L (ref 35–144)
BUN: 16 mg/dL (ref 7–25)
CO2: 31 mmol/L (ref 20–32)
Calcium: 10.3 mg/dL (ref 8.6–10.3)
Chloride: 95 mmol/L — ABNORMAL LOW (ref 98–110)
Creat: 1 mg/dL (ref 0.70–1.11)
Globulin: 2.8 g/dL (calc) (ref 1.9–3.7)
Glucose, Bld: 103 mg/dL — ABNORMAL HIGH (ref 65–99)
Potassium: 4.6 mmol/L (ref 3.5–5.3)
Sodium: 137 mmol/L (ref 135–146)
Total Bilirubin: 0.6 mg/dL (ref 0.2–1.2)
Total Protein: 7.5 g/dL (ref 6.1–8.1)

## 2019-11-20 LAB — LIPID PANEL
Cholesterol: 217 mg/dL — ABNORMAL HIGH (ref <200)
HDL: 68 mg/dL (ref >=40)
LDL Cholesterol (Calc): 130 mg/dL (calc) — ABNORMAL HIGH
Non-HDL Cholesterol (Calc): 149 mg/dL (calc) — ABNORMAL HIGH (ref <130)
Total CHOL/HDL Ratio: 3.2 (calc) (ref <5.0)
Triglycerides: 91 mg/dL (ref <150)

## 2019-11-20 NOTE — Assessment & Plan Note (Signed)
HTN: BP today is a slightly elevated, at home is consistently in the mid 110s / 60 with a heart rate of 80.  Continue Tenoretic, potassium, check daily CMP High cholesterol: Diet controlled, check FLP DJD: Seems to be doing well with no need for pain medication, uses a cane, is trying to remain active. History of Barrett's: On PPIs, last CBC normal. Preventive care reviewed RTC 6 months

## 2019-11-25 DIAGNOSIS — Z23 Encounter for immunization: Secondary | ICD-10-CM | POA: Diagnosis not present

## 2019-12-09 ENCOUNTER — Other Ambulatory Visit: Payer: Self-pay | Admitting: Internal Medicine

## 2020-01-02 ENCOUNTER — Other Ambulatory Visit: Payer: Self-pay | Admitting: Internal Medicine

## 2020-02-11 ENCOUNTER — Telehealth: Payer: Self-pay | Admitting: Internal Medicine

## 2020-02-11 DIAGNOSIS — H9193 Unspecified hearing loss, bilateral: Secondary | ICD-10-CM

## 2020-02-11 NOTE — Telephone Encounter (Signed)
Referral placed.

## 2020-02-11 NOTE — Telephone Encounter (Signed)
Caller: Arad Burston (wife)  Mrs.Najarian states Dr.Paz had giving her husband a referral about 3-5 years ago,she would like another referral  For a hearing exam with  [facility] # is [phone]

## 2020-02-24 ENCOUNTER — Other Ambulatory Visit: Payer: Self-pay | Admitting: Internal Medicine

## 2020-05-19 ENCOUNTER — Other Ambulatory Visit: Payer: Self-pay

## 2020-05-19 ENCOUNTER — Ambulatory Visit (INDEPENDENT_AMBULATORY_CARE_PROVIDER_SITE_OTHER): Payer: Medicare Other | Admitting: Internal Medicine

## 2020-05-19 ENCOUNTER — Encounter: Payer: Self-pay | Admitting: Internal Medicine

## 2020-05-19 VITALS — BP 147/81 | HR 70 | Temp 97.1°F | Ht 72.0 in

## 2020-05-19 DIAGNOSIS — M8949 Other hypertrophic osteoarthropathy, multiple sites: Secondary | ICD-10-CM | POA: Diagnosis not present

## 2020-05-19 DIAGNOSIS — E785 Hyperlipidemia, unspecified: Secondary | ICD-10-CM | POA: Diagnosis not present

## 2020-05-19 DIAGNOSIS — I10 Essential (primary) hypertension: Secondary | ICD-10-CM | POA: Diagnosis not present

## 2020-05-19 DIAGNOSIS — M159 Polyosteoarthritis, unspecified: Secondary | ICD-10-CM

## 2020-05-19 NOTE — Progress Notes (Signed)
Subjective:    Patient ID: Chase Hernandez, male    DOB: 11/22/1933, 87 y.o.   MRN: [id number]  DOS:  05/19/2020 Type of visit - description: Routine checkup  Since the last office visit is doing well. We reviewed his ambulatory BPs Good med compliance He remains active, exercising 3-4 times a week. He is still teaches martial arts every other week.  Review of Systems Denies chest pain no difficulty breathing. No dysphagia or odynophagia. No cough No gross hematuria  Past Medical History:  Diagnosis Date  . Anemia   . Barrett's esophagus   . Colon polyps 08/13/2012   tubular adenomas  . Hyperlipidemia   . Hypertension   . Kidney stones 2012   h/o  . LBBB (left bundle branch block)   . Neck fracture [facility]) 04-2010   Dr Ronnald Ramp, conservative treatnment  . Osteoarthritis    severe hip-back pain, saw neurology, nerve conduction study 2/11 negative; pain was due to hip arthritis    Past Surgical History:  Procedure Laterality Date  . CATARACT EXTRACTION W/ INTRAOCULAR LENS  IMPLANT, BILATERAL  2013  . TONSILLECTOMY    . TOTAL HIP ARTHROPLASTY     right, surgery April 5,2011 @ [place]  10/2009   left    Allergies as of 05/19/2020      Reactions   Oxycodone    Other reaction(s): Other (See Comments) Other Reaction: fecal impaction      Medication List       Accurate as of May 19, 2020 11:59 PM. If you have any questions, ask your nurse or doctor.        atenolol-chlorthalidone 50-25 MG tablet Commonly known as: TENORETIC Take 0.5 tablets by mouth daily.   GLUCOSAMINE HCL PO Take by mouth daily.   pantoprazole 20 MG tablet Commonly known as: PROTONIX TAKE 1 TABLET (20 MG TOTAL) BY MOUTH DAILY BEFORE BREAKFAST.   potassium chloride 10 MEQ tablet Commonly known as: KLOR-CON Take 1 tablet (10 mEq total) by mouth daily.   ZINC PO Take by mouth.          Objective:   Physical Exam BP (!) 147/81 (BP Location: Right  Arm, Patient Position: Sitting, Cuff Size: Large)   Pulse 70   Temp (!) 97.1 F (36.2 C) (Temporal)   Ht 6' (1.829 m)   SpO2 98%   BMI 24.58 kg/m  General:   Well developed, NAD, BMI noted. HEENT:  Normocephalic . Face symmetric, atraumatic Lungs:  CTA B Normal respiratory effort, no intercostal retractions, no accessory muscle use. Heart: RRR,  no murmur.  Lower extremities: no pretibial edema bilaterally  Skin: Not pale. Not jaundice Neurologic:  alert & oriented X3.  Speech normal, gait appropriate for age and assisted by a cane Psych--  Cognition and judgment appear intact.  Cooperative with normal attention span and concentration.  Behavior appropriate. No anxious or depressed appearing.      Assessment    Assessment HTN Hyperlipidemia DJD: See surgeries Neck fracture 2012, Dr. Ronnald Ramp, conservative treatment H/o Kidney stones 2012 Barrett's esophagus H/o anemia: cscope , EGD done 06-2014 : colon polyps, erosive esophagitis and gastritis,+ Barrett's--- f/u prn, no further surveillance scope,  rx PPIs low-dose/long-term  PLAN: HTN: Ambulatory BPs range from 110-120/80-90.  BP today is in the 140s.  No change, continue Tenoretic, checking BPs at home, check BMP Dyslipidemia: Mild dyslipidemia, diet controlled.  Medication?  He is not very interested, he knows that it  could benefit him and decrease his risk of CAD.  We agreed to recheck on RTC. DJD: Well-controlled with occasional Tylenol, uses a cane, he remains active Preventive care: Benefits of COVID booster discussed, plans to proceed. RTC 6 months  This visit occurred during the SARS-CoV-2 public health emergency.  Safety protocols were in place, including screening questions prior to the visit, additional usage of staff PPE, and extensive cleaning of exam room while observing appropriate contact time as indicated for disinfecting solutions.

## 2020-05-19 NOTE — Patient Instructions (Signed)
Check the  blood pressure regularly as you are doing BP GOAL is between 110/65 and  135/85. If it is consistently higher or lower, let me know   GO TO THE LAB : Get the blood work     [place], [place] back for   a checkup in 6 months, fasting

## 2020-05-20 LAB — BASIC METABOLIC PANEL
BUN: 17 mg/dL (ref 6–23)
CO2: 33 mEq/L — ABNORMAL HIGH (ref 19–32)
Calcium: 10 mg/dL (ref 8.4–10.5)
Chloride: 94 mEq/L — ABNORMAL LOW (ref 96–112)
Creatinine, Ser: 0.93 mg/dL (ref 0.40–1.50)
GFR: 75.11 mL/min (ref >60.00)
Glucose, Bld: 100 mg/dL — ABNORMAL HIGH (ref 70–99)
Potassium: 4.5 mEq/L (ref 3.5–5.1)
Sodium: 136 mEq/L (ref 135–145)

## 2020-05-20 NOTE — Assessment & Plan Note (Signed)
HTN: Ambulatory BPs range from 110-120/80-90.  BP today is in the 140s.  No change, continue Tenoretic, checking BPs at home, check BMP Dyslipidemia: Mild dyslipidemia, diet controlled.  Medication?  He is not very interested, he knows that it could benefit him and decrease his risk of CAD.  We agreed to recheck on RTC. DJD: Well-controlled with occasional Tylenol, uses a cane, he remains active Preventive care: Benefits of COVID booster discussed, plans to proceed. RTC 6 months

## 2020-06-08 ENCOUNTER — Other Ambulatory Visit: Payer: Self-pay | Admitting: Internal Medicine

## 2020-06-08 DIAGNOSIS — Z23 Encounter for immunization: Secondary | ICD-10-CM | POA: Diagnosis not present

## 2020-06-09 ENCOUNTER — Telehealth: Payer: Self-pay

## 2020-06-09 NOTE — Telephone Encounter (Signed)
Pt's spouse  called to advise pt received his 2nd Nurse, adult, [employer] from [place] on [employer] on 06/08/20.  Please update in pt's chart.

## 2020-07-20 DIAGNOSIS — H903 Sensorineural hearing loss, bilateral: Secondary | ICD-10-CM | POA: Diagnosis not present

## 2020-09-02 ENCOUNTER — Other Ambulatory Visit: Payer: Self-pay | Admitting: Internal Medicine

## 2020-11-19 ENCOUNTER — Other Ambulatory Visit: Payer: Self-pay

## 2020-11-19 ENCOUNTER — Encounter: Payer: Self-pay | Admitting: Internal Medicine

## 2020-11-19 ENCOUNTER — Ambulatory Visit (INDEPENDENT_AMBULATORY_CARE_PROVIDER_SITE_OTHER): Payer: Medicare Other | Admitting: Internal Medicine

## 2020-11-19 VITALS — BP 146/92 | HR 76 | Temp 97.7°F | Resp 18 | Ht 72.0 in | Wt 180.5 lb

## 2020-11-19 DIAGNOSIS — E785 Hyperlipidemia, unspecified: Secondary | ICD-10-CM | POA: Diagnosis not present

## 2020-11-19 DIAGNOSIS — R739 Hyperglycemia, unspecified: Secondary | ICD-10-CM

## 2020-11-19 DIAGNOSIS — I10 Essential (primary) hypertension: Secondary | ICD-10-CM

## 2020-11-19 DIAGNOSIS — Z23 Encounter for immunization: Secondary | ICD-10-CM | POA: Diagnosis not present

## 2020-11-19 NOTE — Patient Instructions (Signed)
Is very good to see you  Continue checking your blood pressures BP GOAL is between 110/65 and  135/85. If it is consistently higher or lower, let me know     GO TO THE LAB : Get the blood work     [place], [place] back for   a checkup in 6 months

## 2020-11-19 NOTE — Progress Notes (Signed)
Subjective:    Patient ID: Chase Hernandez, male    DOB: 1933-12-31, 87 y.o.   MRN: [id number]  DOS:  11/19/2020 Type of visit - description: f/u  Since the last office visit he is doing well. Ambulatory BPs normal Good med compliance He is trying to stay active, doing some stretching, uses bands instead of weights and he still do some martial arts.  Review of Systems Denies chest pain no difficulty breathing No nausea or vomiting.  No blood in the stools. No LUTS  Past Medical History:  Diagnosis Date   Anemia    Barrett's esophagus    Colon polyps 08/13/2012   tubular adenomas   Hyperlipidemia    Hypertension    Kidney stones 2012   h/o   LBBB (left bundle branch block)    Neck fracture ([place]) 04-2010   Dr Ronnald Ramp, conservative treatnment   Osteoarthritis    severe hip-back pain, saw neurology, nerve conduction study 2/11 negative; pain was due to hip arthritis    Past Surgical History:  Procedure Laterality Date   CATARACT EXTRACTION W/ INTRAOCULAR LENS  IMPLANT, BILATERAL  2013   TONSILLECTOMY     TOTAL HIP ARTHROPLASTY     right, surgery April 5,2011 @ [place]  10/2009   left    Allergies as of 11/19/2020       Reactions   Oxycodone    Other reaction(s): Other (See Comments) Other Reaction: fecal impaction        Medication List        Accurate as of November 19, 2020 11:59 PM. If you have any questions, ask your nurse or doctor.          atenolol-chlorthalidone 50-25 MG tablet Commonly known as: TENORETIC Take 0.5 tablets by mouth daily.   GLUCOSAMINE HCL PO Take by mouth daily.   pantoprazole 20 MG tablet Commonly known as: PROTONIX TAKE 1 TABLET (20 MG TOTAL) BY MOUTH DAILY BEFORE BREAKFAST.   potassium chloride 10 MEQ tablet Commonly known as: KLOR-CON Take 1 tablet (10 mEq total) by mouth daily.   ZINC PO Take by mouth.           Objective:   Physical Exam BP (!) 146/92 (BP Location: Left  Arm, Patient Position: Sitting, Cuff Size: Small)   Pulse 76   Temp 97.7 F (36.5 C) (Oral)   Resp 18   Ht 6' (1.829 m)   Wt 180 lb 8 oz (81.9 kg)   SpO2 97%   BMI 24.48 kg/m  General: Well developed, NAD, BMI noted Neck: No  thyromegaly  HEENT:  Normocephalic . Face symmetric, atraumatic Lungs:  CTA B Normal respiratory effort, no intercostal retractions, no accessory muscle use. Heart: RRR,  no murmur.  Abdomen:  Not distended, soft, non-tender. No rebound or rigidity.  No bruit. Lower extremities: no pretibial edema bilaterally  Skin: Exposed areas without rash. Not pale. Not jaundice Neurologic:  alert & oriented X3.  Speech normal, gait assisted by cane, transfer is somewhat difficult.  Strength symmetric and appropriate for age.  Psych: Cognition and judgment appear intact.  Cooperative with normal attention span and concentration.  Behavior appropriate. No anxious or depressed appearing.     Assessment      Assessment HTN Hyperlipidemia DJD: See surgeries Neck fracture 2012, Dr. Ronnald Ramp, conservative treatment H/o Kidney stones 2012 Barrett's esophagus H/o anemia: cscope , EGD done 06-2014 : colon polyps, erosive esophagitis and gastritis,+ Barrett's--- f/u prn,  no further surveillance scope,  rx PPIs low-dose/long-term  PLAN: HTN: On Tenoretic, BP today 146/92, at home is consistently in the 110/60 with a heart rate in the 80s. Check a BMP, no change. Hyperlipidemia: Diet controlled, although he does not plan to take medications he would like it to be rechecked.  Will do. DJD: On glucosamine, despite DJD he remains very active for his age.  Praised.. Preventive care reviewed. RTC 6 months    This visit occurred during the SARS-CoV-2 public health emergency.  Safety protocols were in place, including screening questions prior to the visit, additional usage of staff PPE, and extensive cleaning of exam room while observing appropriate contact time as indicated  for disinfecting solutions.

## 2020-11-20 LAB — BASIC METABOLIC PANEL
BUN: 13 mg/dL (ref 7–25)
CO2: 32 mmol/L (ref 20–32)
Calcium: 10.5 mg/dL — ABNORMAL HIGH (ref 8.6–10.3)
Chloride: 95 mmol/L — ABNORMAL LOW (ref 98–110)
Creat: 0.82 mg/dL (ref 0.70–1.22)
Glucose, Bld: 98 mg/dL (ref 65–99)
Potassium: 4.6 mmol/L (ref 3.5–5.3)
Sodium: 136 mmol/L (ref 135–146)

## 2020-11-20 LAB — LIPID PANEL
Cholesterol: 208 mg/dL — ABNORMAL HIGH (ref <200)
HDL: 71 mg/dL (ref >=40)
LDL Cholesterol (Calc): 119 mg/dL (calc) — ABNORMAL HIGH
Non-HDL Cholesterol (Calc): 137 mg/dL (calc) — ABNORMAL HIGH (ref <130)
Total CHOL/HDL Ratio: 2.9 (calc) (ref <5.0)
Triglycerides: 84 mg/dL (ref <150)

## 2020-11-20 LAB — HEMOGLOBIN A1C
Hgb A1c MFr Bld: 5.3 % of total Hgb (ref <5.7)
Mean Plasma Glucose: 105 mg/dL
eAG (mmol/L): 5.8 mmol/L

## 2020-11-20 NOTE — Assessment & Plan Note (Signed)
HTN: On Tenoretic, BP today 146/92, at home is consistently in the 110/60 with a heart rate in the 80s. Check a BMP, no change. Hyperlipidemia: Diet controlled, although he does not plan to take medications he would like it to be rechecked.  Will do. DJD: On glucosamine, despite DJD he remains very active for his age.  Praised.. Preventive care reviewed. RTC 6 months

## 2020-11-20 NOTE — Assessment & Plan Note (Signed)
-   Td: 2014 - PNM 23: 2007 and 2019 - prevnar: 2013 - s/p Zostavax and  shingrix:  - flu shot: Today - Covid vaccinations: rec booster w/ new formulation -- DEXA 11-2006 normal --CCS:  no further screening colonoscopy  --Prostate cancer screening: + FH prostate ca;   elected no further testing. -Advance care planning:  documents scanned.

## 2020-12-03 ENCOUNTER — Telehealth: Payer: Self-pay | Admitting: Internal Medicine

## 2020-12-03 ENCOUNTER — Other Ambulatory Visit: Payer: Self-pay | Admitting: Internal Medicine

## 2020-12-03 DIAGNOSIS — Z23 Encounter for immunization: Secondary | ICD-10-CM | POA: Diagnosis not present

## 2020-12-03 NOTE — Telephone Encounter (Signed)
[facility] not updated yet.

## 2020-12-03 NOTE — Telephone Encounter (Signed)
Pt's wife called to notify he got the covid booster.

## 2020-12-06 ENCOUNTER — Ambulatory Visit (INDEPENDENT_AMBULATORY_CARE_PROVIDER_SITE_OTHER): Payer: Medicare Other

## 2020-12-06 VITALS — Ht 72.0 in | Wt 180.0 lb

## 2020-12-06 DIAGNOSIS — Z0000 Encounter for general adult medical examination without abnormal findings: Secondary | ICD-10-CM | POA: Diagnosis not present

## 2020-12-06 NOTE — Patient Instructions (Signed)
Chase Hernandez , Thank you for taking time to complete your Medicare Wellness Visit. I appreciate your ongoing commitment to your health goals. Please review the following plan we discussed and let me know if I can assist you in the future.   Screening recommendations/referrals: Colonoscopy: No longer required Recommended yearly ophthalmology/optometry visit for glaucoma screening and checkup Recommended yearly dental visit for hygiene and checkup  Vaccinations: Influenza vaccine: Up to date Pneumococcal vaccine: Up to date Tdap vaccine: Up to date-Due-05/10/2022 Shingles vaccine: Completed vaccines   Covid-19: Up to date  Advanced directives: Copy in chart  Conditions/risks identified: See problem list  Next appointment: Follow up in one year for your annual wellness visit.   Preventive Care 22 Years and Older, Male Preventive care refers to lifestyle choices and visits with your health care provider that can promote health and wellness. What does preventive care include? A yearly physical exam. This is also called an annual well check. Dental exams once or twice a year. Routine eye exams. Ask your health care provider how often you should have your eyes checked. Personal lifestyle choices, including: Daily care of your teeth and gums. Regular physical activity. Eating a healthy diet. Avoiding tobacco and drug use. Limiting alcohol use. Practicing safe sex. Taking low doses of aspirin every day. Taking vitamin and mineral supplements as recommended by your health care provider. What happens during an annual well check? The services and screenings done by your health care provider during your annual well check will depend on your age, overall health, lifestyle risk factors, and family history of disease. Counseling  Your health care provider may ask you questions about your: Alcohol use. Tobacco use. Drug use. Emotional well-being. Home and relationship well-being. Sexual  activity. Eating habits. History of falls. Memory and ability to understand (cognition). Work and work Statistician. Screening  You may have the following tests or measurements: Height, weight, and BMI. Blood pressure. Lipid and cholesterol levels. These may be checked every 5 years, or more frequently if you are over 16 years old. Skin check. Lung cancer screening. You may have this screening every year starting at age 75 if you have a 30-pack-year history of smoking and currently smoke or have quit within the past 15 years. Fecal occult blood test (FOBT) of the stool. You may have this test every year starting at age 74. Flexible sigmoidoscopy or colonoscopy. You may have a sigmoidoscopy every 5 years or a colonoscopy every 10 years starting at age 69. Prostate cancer screening. Recommendations will vary depending on your family history and other risks. Hepatitis C blood test. Hepatitis B blood test. Sexually transmitted disease (STD) testing. Diabetes screening. This is done by checking your blood sugar (glucose) after you have not eaten for a while (fasting). You may have this done every 1-3 years. Abdominal aortic aneurysm (AAA) screening. You may need this if you are a current or former smoker. Osteoporosis. You may be screened starting at age 70 if you are at high risk. Talk with your health care provider about your test results, treatment options, and if necessary, the need for more tests. Vaccines  Your health care provider may recommend certain vaccines, such as: Influenza vaccine. This is recommended every year. Tetanus, diphtheria, and acellular pertussis (Tdap, Td) vaccine. You may need a Td booster every 10 years. Zoster vaccine. You may need this after age 42. Pneumococcal 13-valent conjugate (PCV13) vaccine. One dose is recommended after age 53. Pneumococcal polysaccharide (PPSV23) vaccine. One dose is recommended after  age 50. Talk to your health care provider about which  screenings and vaccines you need and how often you need them. This information is not intended to replace advice given to you by your health care provider. Make sure you discuss any questions you have with your health care provider. Document Released: 02/19/2015 Document Revised: 10/13/2015 Document Reviewed: 11/24/2014 Elsevier Interactive Patient Education  2017 [place] Prevention in the Home Falls can cause injuries. They can happen to people of all ages. There are many things you can do to make your home safe and to help prevent falls. What can I do on the outside of my home? Regularly fix the edges of walkways and driveways and fix any cracks. Remove anything that might make you trip as you walk through a door, such as a raised step or threshold. Trim any bushes or trees on the path to your home. Use bright outdoor lighting. Clear any walking paths of anything that might make someone trip, such as rocks or tools. Regularly check to see if handrails are loose or broken. Make sure that both sides of any steps have handrails. Any raised decks and porches should have guardrails on the edges. Have any leaves, snow, or ice cleared regularly. Use sand or salt on walking paths during winter. Clean up any spills in your garage right away. This includes oil or grease spills. What can I do in the bathroom? Use night lights. Install grab bars by the toilet and in the tub and shower. Do not use towel bars as grab bars. Use non-skid mats or decals in the tub or shower. If you need to sit down in the shower, use a plastic, non-slip stool. Keep the floor dry. Clean up any water that spills on the floor as soon as it happens. Remove soap buildup in the tub or shower regularly. Attach bath mats securely with double-sided non-slip rug tape. Do not have throw rugs and other things on the floor that can make you trip. What can I do in the bedroom? Use night lights. Make sure that you have a  light by your bed that is easy to reach. Do not use any sheets or blankets that are too big for your bed. They should not hang down onto the floor. Have a firm chair that has side arms. You can use this for support while you get dressed. Do not have throw rugs and other things on the floor that can make you trip. What can I do in the kitchen? Clean up any spills right away. Avoid walking on wet floors. Keep items that you use a lot in easy-to-reach places. If you need to reach something above you, use a strong step stool that has a grab bar. Keep electrical cords out of the way. Do not use floor polish or wax that makes floors slippery. If you must use wax, use non-skid floor wax. Do not have throw rugs and other things on the floor that can make you trip. What can I do with my stairs? Do not leave any items on the stairs. Make sure that there are handrails on both sides of the stairs and use them. Fix handrails that are broken or loose. Make sure that handrails are as long as the stairways. Check any carpeting to make sure that it is firmly attached to the stairs. Fix any carpet that is loose or worn. Avoid having throw rugs at the top or bottom of the stairs. If you do  have throw rugs, attach them to the floor with carpet tape. Make sure that you have a light switch at the top of the stairs and the bottom of the stairs. If you do not have them, ask someone to add them for you. What else can I do to help prevent falls? Wear shoes that: Do not have high heels. Have rubber bottoms. Are comfortable and fit you well. Are closed at the toe. Do not wear sandals. If you use a stepladder: Make sure that it is fully opened. Do not climb a closed stepladder. Make sure that both sides of the stepladder are locked into place. Ask someone to hold it for you, if possible. Clearly mark and make sure that you can see: Any grab bars or handrails. First and last steps. Where the edge of each step  is. Use tools that help you move around (mobility aids) if they are needed. These include: Canes. Walkers. Scooters. Crutches. Turn on the lights when you go into a dark area. Replace any light bulbs as soon as they burn out. Set up your furniture so you have a clear path. Avoid moving your furniture around. If any of your floors are uneven, fix them. If there are any pets around you, be aware of where they are. Review your medicines with your doctor. Some medicines can make you feel dizzy. This can increase your chance of falling. Ask your doctor what other things that you can do to help prevent falls. This information is not intended to replace advice given to you by your health care provider. Make sure you discuss any questions you have with your health care provider. Document Released: 11/19/2008 Document Revised: 07/01/2015 Document Reviewed: 02/27/2014 Elsevier Interactive Patient Education  2017 [employer].

## 2020-12-06 NOTE — Progress Notes (Addendum)
Subjective:   Chase Hernandez is a 87 y.o. male who presents for Medicare Annual/Subsequent preventive examination.  I connected with  today by telephone and verified that I am speaking with the correct person using two identifiers. Location patient: home Location provider: work Persons participating in the virtual visit: patient, Marine scientist.    I discussed the limitations, risks, security and privacy concerns of performing an evaluation and management service by telephone and the availability of in person appointments. I also discussed with the patient that there may be a patient responsible charge related to this service. The patient expressed understanding and verbally consented to this telephonic visit.    Interactive audio and video telecommunications were attempted between this provider and patient, however failed, due to patient having technical difficulties OR patient did not have access to video capability.  We continued and completed visit with audio only.  Some vital signs may be absent or patient reported.   Time Spent with patient on telephone encounter: 20 minutes   Review of Systems     Cardiac Risk Factors include: advanced age (>61men, >51 women);male gender;hypertension;dyslipidemia     Objective:    Today's Vitals   12/06/20 1508  Weight: 180 lb (81.6 kg)  Height: 6' (1.829 m)   Body mass index is 24.41 kg/m.  Advanced Directives 12/06/2020 10/14/2019 09/03/2018 06/26/2017 06/23/2016 06/23/2014  Does Patient Have a Medical Advance Directive? Yes Yes Yes Yes Yes No  Type of Paramedic of [place];Living will [place];Living will [place];Living will [place];Living will [place];Living will -  Does patient want to make changes to medical advance directive? - No - Patient declined No - Patient declined No - Patient declined No - Patient declined -  Copy of [place] in Chart? Yes - validated most recent copy scanned in chart (See row information) No - copy requested No - copy requested No - copy requested No - copy requested -  Would patient like information on creating a medical advance directive? - - - - - No - patient declined information    Current Medications (verified) Outpatient Encounter Medications as of 12/06/2020  Medication Sig   atenolol-chlorthalidone (TENORETIC) 50-25 MG tablet TAKE 1/2 TABLET BY MOUTH EVERY DAY   GLUCOSAMINE HCL PO Take by mouth daily.   Multiple Vitamins-Minerals (ZINC PO) Take by mouth.   pantoprazole (PROTONIX) 20 MG tablet TAKE 1 TABLET (20 MG TOTAL) BY MOUTH DAILY BEFORE BREAKFAST.   potassium chloride (KLOR-CON) 10 MEQ tablet Take 1 tablet (10 mEq total) by mouth daily.   No facility-administered encounter medications on file as of 12/06/2020.    Allergies (verified) Oxycodone   History: Past Medical History:  Diagnosis Date   Anemia    Barrett's esophagus    Colon polyps 08/13/2012   tubular adenomas   Hyperlipidemia    Hypertension    Kidney stones 2012   h/o   LBBB (left bundle branch block)    Neck fracture ([place]) 04-2010   Dr Ronnald Ramp, conservative treatnment   Osteoarthritis    severe hip-back pain, saw neurology, nerve conduction study 2/11 negative; pain was due to hip arthritis   Past Surgical History:  Procedure Laterality Date   CATARACT EXTRACTION W/ INTRAOCULAR LENS  IMPLANT, BILATERAL  2013   TONSILLECTOMY     TOTAL HIP ARTHROPLASTY     right, surgery April 5,2011 @ [facility]  10/2009   left   Family History  Problem Relation Age of Onset   Breast cancer Mother    Diabetes Other        GM   Prostate cancer Father 27   Heart attack Neg Hx    Colon cancer Neg Hx    Social History   Socioeconomic History   Marital status: Married    Spouse name: Not on file   Number of children: 1   Years of education: Not on file   Highest  education level: Not on file  Occupational History   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[employer]), retired Financial trader: RETIRED  Tobacco Use   Smoking status: Never   Smokeless tobacco: Never  Substance and Sexual Activity   Alcohol use: Yes    Alcohol/week: 0.0 standard drinks    Comment: 6 oz red wine per night   Drug use: No   Sexual activity: Never  Other Topics Concern   Not on file  Social History Narrative   original from [place] , 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[employer]), retired Agricultural consultant    Married, 1 son who lives in [place], martial arts    Social Determinants of Health   Financial Resource Strain: Low Risk    Difficulty of Paying Living Expenses: Not hard at all  Food Insecurity: No Food Insecurity   Worried About Charity fundraiser in the Last Year: Never true   Arboriculturist in the Last Year: Never true  Transportation Needs: No Transportation Needs   Lack of Transportation (Medical): No   Lack of Transportation (Non-Medical): No  Physical Activity: Sufficiently Active   Days of Exercise per Week: 4 days   Minutes of Exercise per Session: 40 min  Stress: No Stress Concern Present   Feeling of Stress : Not at all  Social Connections: Socially Isolated   Frequency of Communication with Friends and Family: Once a week   Frequency of Social Gatherings with Friends and Family: Never   Attends Religious Services: Never   Marine scientist or Organizations: No   Attends Music therapist: Never   Marital Status: Married    Tobacco Counseling Counseling given: Not Answered   Clinical Intake:  Pre-visit preparation completed: Yes  Pain : No/denies pain     BMI - recorded: 24.41 Nutritional Status: BMI of 19-24  Normal Nutritional Risks: None Diabetes: No  How often do you need to have someone help you when you read instructions, pamphlets, or other written materials from your  doctor or pharmacy?: 1 - Never  Diabetic?No  Interpreter Needed?: No  Information entered by :: Caroleen Hamman LPN   Activities of Daily Living In your present state of health, do you have any difficulty performing the following activities: 12/06/2020 11/19/2020  Hearing? N N  Vision? N N  Difficulty concentrating or making decisions? N N  Walking or climbing stairs? N N  Dressing or bathing? N N  Doing errands, shopping? N N  Preparing Food and eating ? N -  Using the Toilet? N -  In the past six months, have you accidently leaked urine? N -  Do you have problems with loss of bowel control? N -  Managing your Medications? N -  Managing your Finances? N -  Housekeeping or managing your Housekeeping? N -  Some recent data might be hidden    Patient Care Team: Kathlene November  E, MD as PCP - General Ladene Artist, MD as Consulting Physician (Gastroenterology)  Indicate any recent Medical Services you may have received from other than [facility] providers in the past year (date may be approximate).     Assessment:   This is a routine wellness examination for Kaidon.  Hearing/Vision screen Hearing Screening - Comments:: Bilateral hearing aids Vision Screening - Comments:: Last eye exam- 2 years ago-Dr. Tommy Rainwater  Dietary issues and exercise activities discussed: Current Exercise Habits: Home exercise routine, Type of exercise: strength training/weights, Time (Minutes): 40, Frequency (Times/Week): 4, Weekly Exercise (Minutes/Week): 160, Intensity: Mild, Exercise limited by: None identified   Goals Addressed               This Visit's Progress     Patient Stated     Maintain current healthy lifestyle (pt-stated)   On track      Depression Screen PHQ 2/9 Scores 12/06/2020 11/19/2020 10/14/2019 09/03/2018 09/03/2018 06/26/2017 06/26/2017  PHQ - 2 Score 0 0 0 0 0 0 0    Fall Risk Fall Risk  12/06/2020 11/19/2020 05/19/2020 10/14/2019 01/01/2019  Falls in the past year? 0 0 0 0 0   Comment - - - - Emmi Telephone Survey: data to providers prior to load  Number falls in past yr: 0 0 0 0 -  Injury with Fall? 0 0 0 0 -  Follow up Falls prevention discussed Falls evaluation completed - Education provided;Falls prevention discussed -    FALL RISK PREVENTION PERTAINING TO THE HOME:  Any stairs in or around the home? No  Home free of loose throw rugs in walkways, pet beds, electrical cords, etc? Yes  Adequate lighting in your home to reduce risk of falls? Yes   ASSISTIVE DEVICES UTILIZED TO PREVENT FALLS:  Life alert? No  Use of a cane, walker or w/c? No  Grab bars in the bathroom? No  Shower chair or bench in shower? Yes  Elevated toilet seat or a handicapped toilet? No   TIMED UP AND GO:  Was the test performed? No . Phone visit   Cognitive Function:Normal cognitive status assessed by this Nurse Health Advisor. No abnormalities found.   MMSE - Mini Mental State Exam 06/26/2017  Orientation to time 5  Orientation to Place 5  Registration 3  Attention/ Calculation 5  Recall 2  Language- name 2 objects 2  Language- repeat 1  Language- follow 3 step command 3  Language- read & follow direction 1  Write a sentence 1  Copy design 1  Total score 29        Immunizations Immunization History  Administered Date(s) Administered   Fluad Quad(high Dose 65+) 11/19/2019, 11/19/2020   Influenza Split 11/14/2017   Influenza Whole 11/23/2006, 02/08/2009, 12/03/2011   Influenza, High Dose Seasonal PF 10/22/2018   Influenza-Unspecified 12/26/2013, 12/08/2014, 12/06/2016   PFIZER Comirnaty(Gray Top)Covid-19 Tri-Sucrose Vaccine 06/08/2020   PFIZER(Purple Top)SARS-COV-2 Vaccination 04/17/2019, 05/08/2019, 11/25/2019   Pneumococcal Conjugate-13 05/23/2013   Pneumococcal Polysaccharide-23 12/07/2005, 06/26/2017   Td 04/09/2002   Tdap 05/09/2012   Zoster Recombinat (Shingrix) 09/06/2018, 03/14/2019   Zoster, Live 04/01/2008    TDAP status: Up to date  Flu  Vaccine status: Up to date  Pneumococcal vaccine status: Up to date  Covid-19 vaccine status: Completed vaccines  Qualifies for Shingles Vaccine? No   Zostavax completed Yes   Shingrix Completed?: Yes  Screening Tests Health Maintenance  Topic Date Due   TETANUS/TDAP  05/10/2022   Pneumonia Vaccine 58+ Years old  Completed   INFLUENZA VACCINE  Completed   COVID-19 Vaccine  Completed   Zoster Vaccines- Shingrix  Completed   HPV VACCINES  Aged Out    Health Maintenance  There are no preventive care reminders to display for this patient.  Colorectal cancer screening: No longer required.   Lung Cancer Screening: (Low Dose CT Chest recommended if Age 41-80 years, 30 pack-year currently smoking OR have quit w/in 15years.) does not qualify.     Additional Screening:  Hepatitis C Screening: does not qualify  Vision Screening: Recommended annual ophthalmology exams for early detection of glaucoma and other disorders of the eye. Is the patient up to date with their annual eye exam?  No  Who is the provider or what is the name of the office in which the patient attends annual eye exams? Dr. Tommy Rainwater Patient advised to make an appt  Dental Screening: Recommended annual dental exams for proper oral hygiene  Community Resource Referral / Chronic Care Management: CRR required this visit?  No   CCM required this visit?  No      Plan:     I have personally reviewed and noted the following in the patient's chart:   Medical and social history Use of alcohol, tobacco or illicit drugs  Current medications and supplements including opioid prescriptions. Patient is not currently taking opioid prescriptions. Functional ability and status Nutritional status Physical activity Advanced directives List of other physicians Hospitalizations, surgeries, and ER visits in previous 12 months Vitals Screenings to include cognitive, depression, and falls Referrals and appointments  In  addition, I have reviewed and discussed with patient certain preventive protocols, quality metrics, and best practice recommendations. A written personalized care plan for preventive services as well as general preventive health recommendations were provided to patient.   Due to this being a telephonic visit, the after visit summary with patients personalized plan was offered to patient via mail or my-chart.  Patient would like to access on my-chart.   Marta Antu, LPN   [id number]  Nurse Health Advisor  Nurse Notes: None  I have reviewed and agree with Health Coaches documentation.  Kathlene November, MD

## 2021-02-21 ENCOUNTER — Other Ambulatory Visit: Payer: Self-pay | Admitting: Internal Medicine

## 2021-02-25 ENCOUNTER — Other Ambulatory Visit: Payer: Self-pay | Admitting: Internal Medicine

## 2021-03-06 DIAGNOSIS — U071 COVID-19: Secondary | ICD-10-CM | POA: Diagnosis not present

## 2021-05-23 ENCOUNTER — Encounter: Payer: Self-pay | Admitting: Internal Medicine

## 2021-05-23 ENCOUNTER — Ambulatory Visit (INDEPENDENT_AMBULATORY_CARE_PROVIDER_SITE_OTHER): Payer: Medicare Other | Admitting: Internal Medicine

## 2021-05-23 VITALS — BP 136/80 | HR 74 | Temp 97.9°F | Resp 16 | Ht 72.0 in | Wt 176.5 lb

## 2021-05-23 DIAGNOSIS — K2270 Barrett's esophagus without dysplasia: Secondary | ICD-10-CM

## 2021-05-23 DIAGNOSIS — I10 Essential (primary) hypertension: Secondary | ICD-10-CM | POA: Diagnosis not present

## 2021-05-23 NOTE — Patient Instructions (Signed)
Check the  blood pressure regularly ?BP GOAL is between 110/65 and  135/85. ?If it is consistently higher or lower, let me know ? ?  ? ?GO TO THE LAB : Get the blood work   ? ? ?Chase Hernandez, [place] ?Come back for a checkup in 5 to 6 months ?

## 2021-05-23 NOTE — Progress Notes (Signed)
? ?  Subjective:  ? ? Patient ID: Chase Hernandez, male    DOB: August 01, 1933, 88 y.o.   MRN: [id number] ? ?DOS:  05/23/2021 ?Type of visit - description: Follow-up ? ?Since the last office visit he is feeling well. ?He is trying to remain as active as possible and eating healthy. ?Good compliance with medications. ? ?Denies any GERD symptoms. ?No chest pain or difficulty breathing ?No nausea or vomiting ? ?Review of Systems ?See above  ? ?Past Medical History:  ?Diagnosis Date  ? Anemia   ? Barrett's esophagus   ? Colon polyps 08/13/2012  ? tubular adenomas  ? Hyperlipidemia   ? Hypertension   ? Kidney stones 2012  ? h/o  ? LBBB (left bundle branch block)   ? Neck fracture ([place]) 04-2010  ? Dr Ronnald Ramp, conservative treatnment  ? Osteoarthritis   ? severe hip-back pain, saw neurology, nerve conduction study 2/11 negative; pain was due to hip arthritis  ? ? ?Past Surgical History:  ?Procedure Laterality Date  ? CATARACT EXTRACTION W/ INTRAOCULAR LENS  IMPLANT, BILATERAL  2013  ? TONSILLECTOMY    ? TOTAL HIP ARTHROPLASTY    ? right, surgery April 5,2011 @ [facility]  10/2009  ? left  ? ? ?Current Outpatient Medications  ?Medication Instructions  ? atenolol-chlorthalidone (TENORETIC) 50-25 MG tablet TAKE 1/2 TABLET BY MOUTH EVERY DAY  ? GLUCOSAMINE HCL PO Daily  ? Multiple Vitamins-Minerals (ZINC PO) Oral,    ? pantoprazole (PROTONIX) 20 MG tablet TAKE 1 TABLET BY MOUTH DAILY BEFORE BREAKFAST.  ? potassium chloride (KLOR-CON) 10 MEQ tablet TAKE 1 TABLET BY MOUTH EVERY DAY  ? ? ?   ?Objective:  ? Physical Exam ?BP 136/80 (BP Location: Left Arm, Patient Position: Sitting, Cuff Size: Small)   Pulse 74   Temp 97.9 ?F (36.6 ?C) (Oral)   Resp 16   Ht 6' (1.829 m)   Wt 176 lb 8 oz (80.1 kg)   SpO2 98%   BMI 23.94 kg/m?  ?General:   ?Well developed, NAD, BMI noted. ?HEENT:  ?Normocephalic . Face symmetric, atraumatic ?Lungs:  ?CTA B ?Normal respiratory effort, no intercostal retractions, no accessory  muscle use. ?Heart: RRR,  no murmur.  ?Lower extremities: no pretibial edema bilaterally  ?Skin: Not pale. Not jaundice ?Neurologic:  ?alert & oriented X3.  ?Speech normal, gait appropriate for age, assisted by cane ?Psych--  ?Cognition and judgment appear intact.  ?Cooperative with normal attention span and concentration.  ?Behavior appropriate. ?No anxious or depressed appearing.  ? ?   ?Assessment   ? ?Assessment ?HTN ?Hyperlipidemia ?DJD: See surgeries ?Neck fracture 2012, Dr. Ronnald Ramp, conservative treatment ?H/o Kidney stones 2012 ?Barrett's esophagus ?H/o anemia: cscope , EGD done 06-2014 : colon polyps, erosive esophagitis and gastritis,+ Barrett's--- f/u prn, no further surveillance scope,  rx PPIs low-dose/long-term ? ?PLAN: ?HTN: Ambulatory BPs are great, ~ 114/68.  Heart rate in the 80s.  Continue Tenoretic, potassium, check a CMP and CBC ?Hyperlipidemia: Diet controlled ?History of Barrett's esophagus: No further scopes for surveillance, on PPIs, asymptomatic. ?Vaccine advice: We reviewed together his previous vaccine, he is up-to-date. ?RTC 5 to 6 months ? ? ?This visit occurred during the SARS-CoV-2 public health emergency.  Safety protocols were in place, including screening questions prior to the visit, additional usage of staff PPE, and extensive cleaning of exam room while observing appropriate contact time as indicated for disinfecting solutions.  ? ?

## 2021-05-24 LAB — COMPREHENSIVE METABOLIC PANEL
ALT: 26 U/L (ref 0–53)
AST: 21 U/L (ref 0–37)
Albumin: 4.7 g/dL (ref 3.5–5.2)
Alkaline Phosphatase: 61 U/L (ref 39–117)
BUN: 14 mg/dL (ref 6–23)
CO2: 32 mEq/L (ref 19–32)
Calcium: 10.1 mg/dL (ref 8.4–10.5)
Chloride: 93 mEq/L — ABNORMAL LOW (ref 96–112)
Creatinine, Ser: 0.85 mg/dL (ref 0.40–1.50)
GFR: 78.92 mL/min (ref >60.00)
Glucose, Bld: 96 mg/dL (ref 70–99)
Potassium: 3.7 mEq/L (ref 3.5–5.1)
Sodium: 134 mEq/L — ABNORMAL LOW (ref 135–145)
Total Bilirubin: 0.6 mg/dL (ref 0.2–1.2)
Total Protein: 7.5 g/dL (ref 6.0–8.3)

## 2021-05-24 LAB — CBC WITH DIFFERENTIAL/PLATELET
Basophils Absolute: 0.1 10*3/uL (ref 0.0–0.1)
Basophils Relative: 1 % (ref 0.0–3.0)
Eosinophils Absolute: 0.2 10*3/uL (ref 0.0–0.7)
Eosinophils Relative: 1.8 % (ref 0.0–5.0)
HCT: 42.5 % (ref 39.0–52.0)
Hemoglobin: 14.7 g/dL (ref 13.0–17.0)
Lymphocytes Relative: 15 % (ref 12.0–46.0)
Lymphs Abs: 1.3 10*3/uL (ref 0.7–4.0)
MCHC: 34.6 g/dL (ref 30.0–36.0)
MCV: 98.1 fl (ref 78.0–100.0)
Monocytes Absolute: 0.9 10*3/uL (ref 0.1–1.0)
Monocytes Relative: 9.7 % (ref 3.0–12.0)
Neutro Abs: 6.4 10*3/uL (ref 1.4–7.7)
Neutrophils Relative %: 72.5 % (ref 43.0–77.0)
Platelets: 347 10*3/uL (ref 150.0–400.0)
RBC: 4.33 Mil/uL (ref 4.22–5.81)
RDW: 12.8 % (ref 11.5–15.5)
WBC: 8.8 10*3/uL (ref 4.0–10.5)

## 2021-05-24 NOTE — Assessment & Plan Note (Signed)
HTN: Ambulatory BPs are great, ~ 114/68.  Heart rate in the 80s.  Continue Tenoretic, potassium, check a CMP and CBC ?Hyperlipidemia: Diet controlled ?History of Barrett's esophagus: No further scopes for surveillance, on PPIs, asymptomatic. ?Vaccine advice: We reviewed together his previous vaccine, he is up-to-date. ?RTC 5 to 6 months ?

## 2021-05-26 DIAGNOSIS — U071 COVID-19: Secondary | ICD-10-CM | POA: Diagnosis not present

## 2021-06-14 ENCOUNTER — Other Ambulatory Visit: Payer: Self-pay | Admitting: Internal Medicine

## 2021-10-24 ENCOUNTER — Encounter: Payer: Self-pay | Admitting: Internal Medicine

## 2021-10-24 ENCOUNTER — Ambulatory Visit (INDEPENDENT_AMBULATORY_CARE_PROVIDER_SITE_OTHER): Payer: Medicare Other | Admitting: Internal Medicine

## 2021-10-24 VITALS — BP 122/62 | HR 72 | Temp 98.1°F | Resp 16 | Ht 72.0 in | Wt 175.2 lb

## 2021-10-24 DIAGNOSIS — E785 Hyperlipidemia, unspecified: Secondary | ICD-10-CM

## 2021-10-24 DIAGNOSIS — I10 Essential (primary) hypertension: Secondary | ICD-10-CM | POA: Diagnosis not present

## 2021-10-24 DIAGNOSIS — Z23 Encounter for immunization: Secondary | ICD-10-CM | POA: Diagnosis not present

## 2021-10-24 NOTE — Patient Instructions (Addendum)
Vaccines I recommend: COVID-vaccine  RSV  GO TO THE LAB : Get the blood work     [place], [place] back for a checkup in 6 months

## 2021-10-24 NOTE — Progress Notes (Signed)
   Subjective:    Patient ID: Chase Hernandez, male    DOB: 1933-11-25, 88 y.o.   MRN: [id number]  DOS:  10/24/2021 Type of visit - description: Follow-up  Since the last office visit is doing well. Good med compliance Ambulatory BPs within normal No chest pain no difficulty breathing No edema   Review of Systems See above   Past Medical History:  Diagnosis Date   Anemia    Barrett's esophagus    Colon polyps 08/13/2012   tubular adenomas   Hyperlipidemia    Hypertension    Kidney stones 2012   h/o   LBBB (left bundle branch block)    Neck fracture ([facility]) 04-2010   Dr Yetta Barre, conservative treatnment   Osteoarthritis    severe hip-back pain, saw neurology, nerve conduction study 2/11 negative; pain was due to hip arthritis    Past Surgical History:  Procedure Laterality Date   CATARACT EXTRACTION W/ INTRAOCULAR LENS  IMPLANT, BILATERAL  2013   TONSILLECTOMY     TOTAL HIP ARTHROPLASTY     right, surgery April 5,2011 @ [facility]   TOTAL HIP ARTHROPLASTY  10/2009   left    Current Outpatient Medications  Medication Instructions   atenolol-chlorthalidone (TENORETIC) 50-25 MG tablet TAKE 1/2 TABLET BY MOUTH EVERY DAY   GLUCOSAMINE HCL PO Daily   Multiple Vitamins-Minerals (ZINC PO) Oral,     pantoprazole (PROTONIX) 20 MG tablet TAKE 1 TABLET BY MOUTH EVERY DAY BEFORE BREAKFAST   potassium chloride (KLOR-CON) 10 MEQ tablet TAKE 1 TABLET BY MOUTH EVERY DAY      Objective:   Physical Exam BP 122/62   Pulse 72   Temp 98.1 F (36.7 C) (Oral)   Resp 16   Ht 6' (1.829 m)   Wt 175 lb 4 oz (79.5 kg)   SpO2 97%   BMI 23.77 kg/m  General:   Well developed, NAD, BMI noted. HEENT:  Normocephalic . Face symmetric, atraumatic Lungs:  CTA B Normal respiratory effort, no intercostal retractions, no accessory muscle use. Heart: RRR,  no murmur.  Lower extremities: no pretibial edema bilaterally  Skin: Not pale. Not jaundice Neurologic:  alert & oriented X3.  Speech  normal, gait appropriate for age and unassisted Psych--  Cognition and judgment appear intact.  Cooperative with normal attention span and concentration.  Behavior appropriate. No anxious or depressed appearing.      Assessment    Assessment HTN Hyperlipidemia DJD: See surgeries Neck fracture 2012, Dr. Yetta Barre, conservative treatment H/o Kidney stones 2012 Barrett's esophagus H/o anemia: cscope , EGD done 06-2014 : colon polyps, erosive esophagitis and gastritis,+ Barrett's--- f/u prn, no further surveillance scope,  rx PPIs low-dose/long-term  PLAN: Here for follow-up HTN: Ambulatory BPs are great, 116/69, 117/65.  Continue Tenoretic, potassium, check labs. Hyperlipidemia: Has a healthy lifestyle, still active, check FLP, he will be somewhat hesitant to take medication. Barrett's esophagus: On PPIs.  No symptoms Preventive care discussed RTC 6 months

## 2021-10-25 LAB — BASIC METABOLIC PANEL
BUN: 16 mg/dL (ref 6–23)
CO2: 30 mEq/L (ref 19–32)
Calcium: 10.2 mg/dL (ref 8.4–10.5)
Chloride: 93 mEq/L — ABNORMAL LOW (ref 96–112)
Creatinine, Ser: 0.85 mg/dL (ref 0.40–1.50)
GFR: 78.68 mL/min (ref >60.00)
Glucose, Bld: 92 mg/dL (ref 70–99)
Potassium: 4.1 mEq/L (ref 3.5–5.1)
Sodium: 134 mEq/L — ABNORMAL LOW (ref 135–145)

## 2021-10-25 LAB — LIPID PANEL
Cholesterol: 175 mg/dL (ref 0–200)
HDL: 66.9 mg/dL (ref >39.00)
LDL Cholesterol: 93 mg/dL (ref 0–99)
NonHDL: 107.82
Total CHOL/HDL Ratio: 3
Triglycerides: 76 mg/dL (ref 0.0–149.0)
VLDL: 15.2 mg/dL (ref 0.0–40.0)

## 2021-10-25 LAB — TSH: TSH: 1.51 u[IU]/mL (ref 0.35–5.50)

## 2021-10-25 NOTE — Assessment & Plan Note (Signed)
Preventive care reviewed - Td: 2014 - PNM 23: 2007 and 2019 - prevnar: 2013 - s/p Zostavax and  shingrix:  -Vaccines I recommend: COVID, flu shot and RSV.

## 2021-10-25 NOTE — Assessment & Plan Note (Signed)
Here for follow-up HTN: Ambulatory BPs are great, 116/69, 117/65.  Continue Tenoretic, potassium, check labs. Hyperlipidemia: Has a healthy lifestyle, still active, check FLP, he will be somewhat hesitant to take medication. Barrett's esophagus: On PPIs.  No symptoms Preventive care discussed RTC 6 months

## 2021-12-05 ENCOUNTER — Other Ambulatory Visit (HOSPITAL_BASED_OUTPATIENT_CLINIC_OR_DEPARTMENT_OTHER): Payer: Self-pay

## 2021-12-05 DIAGNOSIS — Z23 Encounter for immunization: Secondary | ICD-10-CM | POA: Diagnosis not present

## 2021-12-05 MED ORDER — COMIRNATY 30 MCG/0.3ML IM SUSY
PREFILLED_SYRINGE | INTRAMUSCULAR | 0 refills | Status: DC
  Filled 2021-12-05: qty 0.3, 1d supply, fill #0

## 2021-12-07 ENCOUNTER — Ambulatory Visit (INDEPENDENT_AMBULATORY_CARE_PROVIDER_SITE_OTHER): Payer: Medicare Other | Admitting: *Deleted

## 2021-12-07 DIAGNOSIS — Z0000 Encounter for general adult medical examination without abnormal findings: Secondary | ICD-10-CM

## 2021-12-07 NOTE — Patient Instructions (Signed)
Mr. Stocking , Thank you for taking time to come for your Medicare Wellness Visit. I appreciate your ongoing commitment to your health goals. Please review the following plan we discussed and let me know if I can assist you in the future.   These are the goals we discussed:  Goals       Maintain current healthy lifestyle (pt-stated)        This is a list of the screening recommended for you and due dates:  Health Maintenance  Topic Date Due   COVID-19 Vaccine (6 - Pfizer series) 04/05/2021   Tetanus Vaccine  05/10/2022   Medicare Annual Wellness Visit  12/08/2022   Pneumonia Vaccine  Completed   Flu Shot  Completed   Zoster (Shingles) Vaccine  Completed   HPV Vaccine  Aged Out     Next appointment: Follow up in one year for your annual wellness visit.   Preventive Care 43 Years and Older, Male Preventive care refers to lifestyle choices and visits with your health care provider that can promote health and wellness. What does preventive care include? A yearly physical exam. This is also called an annual well check. Dental exams once or twice a year. Routine eye exams. Ask your health care provider how often you should have your eyes checked. Personal lifestyle choices, including: Daily care of your teeth and gums. Regular physical activity. Eating a healthy diet. Avoiding tobacco and drug use. Limiting alcohol use. Practicing safe sex. Taking low doses of aspirin every day. Taking vitamin and mineral supplements as recommended by your health care provider. What happens during an annual well check? The services and screenings done by your health care provider during your annual well check will depend on your age, overall health, lifestyle risk factors, and family history of disease. Counseling  Your health care provider may ask you questions about your: Alcohol use. Tobacco use. Drug use. Emotional well-being. Home and relationship well-being. Sexual activity. Eating  habits. History of falls. Memory and ability to understand (cognition). Work and work Statistician. Screening  You may have the following tests or measurements: Height, weight, and BMI. Blood pressure. Lipid and cholesterol levels. These may be checked every 5 years, or more frequently if you are over 86 years old. Skin check. Lung cancer screening. You may have this screening every year starting at age 86 if you have a 30-pack-year history of smoking and currently smoke or have quit within the past 15 years. Fecal occult blood test (FOBT) of the stool. You may have this test every year starting at age 86. Flexible sigmoidoscopy or colonoscopy. You may have a sigmoidoscopy every 5 years or a colonoscopy every 10 years starting at age 86. Prostate cancer screening. Recommendations will vary depending on your family history and other risks. Hepatitis C blood test. Hepatitis B blood test. Sexually transmitted disease (STD) testing. Diabetes screening. This is done by checking your blood sugar (glucose) after you have not eaten for a while (fasting). You may have this done every 1-3 years. Abdominal aortic aneurysm (AAA) screening. You may need this if you are a current or former smoker. Osteoporosis. You may be screened starting at age 86 if you are at high risk. Talk with your health care provider about your test results, treatment options, and if necessary, the need for more tests. Vaccines  Your health care provider may recommend certain vaccines, such as: Influenza vaccine. This is recommended every year. Tetanus, diphtheria, and acellular pertussis (Tdap, Td) vaccine. You may  need a Td booster every 10 years. Zoster vaccine. You may need this after age 86. Pneumococcal 13-valent conjugate (PCV13) vaccine. One dose is recommended after age 86. Pneumococcal polysaccharide (PPSV23) vaccine. One dose is recommended after age 86. Talk to your health care provider about which screenings and  vaccines you need and how often you need them. This information is not intended to replace advice given to you by your health care provider. Make sure you discuss any questions you have with your health care provider. Document Released: 02/19/2015 Document Revised: 10/13/2015 Document Reviewed: 11/24/2014 Elsevier Interactive Patient Education  2017 [place] Prevention in the Home Falls can cause injuries. They can happen to people of all ages. There are many things you can do to make your home safe and to help prevent falls. What can I do on the outside of my home? Regularly fix the edges of walkways and driveways and fix any cracks. Remove anything that might make you trip as you walk through a door, such as a raised step or threshold. Trim any bushes or trees on the path to your home. Use bright outdoor lighting. Clear any walking paths of anything that might make someone trip, such as rocks or tools. Regularly check to see if handrails are loose or broken. Make sure that both sides of any steps have handrails. Any raised decks and porches should have guardrails on the edges. Have any leaves, snow, or ice cleared regularly. Use sand or salt on walking paths during winter. Clean up any spills in your garage right away. This includes oil or grease spills. What can I do in the bathroom? Use night lights. Install grab bars by the toilet and in the tub and shower. Do not use towel bars as grab bars. Use non-skid mats or decals in the tub or shower. If you need to sit down in the shower, use a plastic, non-slip stool. Keep the floor dry. Clean up any water that spills on the floor as soon as it happens. Remove soap buildup in the tub or shower regularly. Attach bath mats securely with double-sided non-slip rug tape. Do not have throw rugs and other things on the floor that can make you trip. What can I do in the bedroom? Use night lights. Make sure that you have a light by your  bed that is easy to reach. Do not use any sheets or blankets that are too big for your bed. They should not hang down onto the floor. Have a firm chair that has side arms. You can use this for support while you get dressed. Do not have throw rugs and other things on the floor that can make you trip. What can I do in the kitchen? Clean up any spills right away. Avoid walking on wet floors. Keep items that you use a lot in easy-to-reach places. If you need to reach something above you, use a strong step stool that has a grab bar. Keep electrical cords out of the way. Do not use floor polish or wax that makes floors slippery. If you must use wax, use non-skid floor wax. Do not have throw rugs and other things on the floor that can make you trip. What can I do with my stairs? Do not leave any items on the stairs. Make sure that there are handrails on both sides of the stairs and use them. Fix handrails that are broken or loose. Make sure that handrails are as long as the stairways.  Check any carpeting to make sure that it is firmly attached to the stairs. Fix any carpet that is loose or worn. Avoid having throw rugs at the top or bottom of the stairs. If you do have throw rugs, attach them to the floor with carpet tape. Make sure that you have a light switch at the top of the stairs and the bottom of the stairs. If you do not have them, ask someone to add them for you. What else can I do to help prevent falls? Wear shoes that: Do not have high heels. Have rubber bottoms. Are comfortable and fit you well. Are closed at the toe. Do not wear sandals. If you use a stepladder: Make sure that it is fully opened. Do not climb a closed stepladder. Make sure that both sides of the stepladder are locked into place. Ask someone to hold it for you, if possible. Clearly mark and make sure that you can see: Any grab bars or handrails. First and last steps. Where the edge of each step is. Use tools that  help you move around (mobility aids) if they are needed. These include: Canes. Walkers. Scooters. Crutches. Turn on the lights when you go into a dark area. Replace any light bulbs as soon as they burn out. Set up your furniture so you have a clear path. Avoid moving your furniture around. If any of your floors are uneven, fix them. If there are any pets around you, be aware of where they are. Review your medicines with your doctor. Some medicines can make you feel dizzy. This can increase your chance of falling. Ask your doctor what other things that you can do to help prevent falls. This information is not intended to replace advice given to you by your health care provider. Make sure you discuss any questions you have with your health care provider. Document Released: 11/19/2008 Document Revised: 07/01/2015 Document Reviewed: 02/27/2014 Elsevier Interactive Patient Education  2017 [employer].

## 2021-12-07 NOTE — Progress Notes (Signed)
Subjective:   Chase Hernandez is a 88 y.o. male who presents for Medicare Annual/Subsequent preventive examination.  I connected with  Chase Hernandez on 12/07/21 by a audio enabled telemedicine application and verified that I am speaking with the correct person using two identifiers.  Patient Location: Home  Provider Location: Office/Clinic  I discussed the limitations of evaluation and management by telemedicine. The patient expressed understanding and agreed to proceed.   Review of Systems    Defer to PCP Cardiac Risk Factors include: dyslipidemia;male gender;hypertension;advanced age (>61mn, >>54women)     Objective:    There were no vitals filed for this visit. There is no height or weight on file to calculate BMI.     12/07/2021    3:42 PM 12/06/2020    3:10 PM 10/14/2019    2:23 PM 09/03/2018    2:16 PM 06/26/2017    2:12 PM 06/23/2016    2:40 PM 06/23/2014   12:48 PM  Advanced Directives  Does Patient Have a Medical Advance Directive? _0  Yes No  Type of AParamedicof ADiamond RidgeLiving will H[place]Living will HPitcairnLiving will HQuincyLiving will HEllsworthLiving will HGaryLiving will   Does patient want to make changes to medical advance directive? No - Patient declined  No - Patient declined No - Patient declined No - Patient declined No - Patient declined   Copy of HGl[place]in Chart? Yes - validated most recent copy scanned in chart (See row information) Yes - validated most recent copy scanned in chart (See row information) No - copy requested No - copy requested No - copy requested No - copy requested   Would patient like information on creating a medical advance directive?       No - patient declined information    Current Medications (verified) Outpatient Encounter Medications as of 12/07/2021   Medication Sig   atenolol-chlorthalidone (TENORETIC) 50-25 MG tablet TAKE 1/2 TABLET BY MOUTH EVERY DAY   COVID-19 mRNA vaccine 2023-2024 (COMIRNATY) syringe Inject into the muscle.   GLUCOSAMINE HCL PO Take by mouth daily.   Multiple Vitamins-Minerals (ZINC PO) Take by mouth.   pantoprazole (PROTONIX) 20 MG tablet TAKE 1 TABLET BY MOUTH EVERY DAY BEFORE BREAKFAST   potassium chloride (KLOR-CON) 10 MEQ tablet TAKE 1 TABLET BY MOUTH EVERY DAY   No facility-administered encounter medications on file as of 12/07/2021.    Allergies (verified) Oxycodone   History: Past Medical History:  Diagnosis Date   Anemia    Barrett's esophagus    Colon polyps 08/13/2012   tubular adenomas   Hyperlipidemia    Hypertension    Kidney stones 2012   h/o   LBBB (left bundle branch block)    [place]ck [place] 04[place]   Dr JRonnald Ramp conservative treatnment   Osteoarthritis    severe hip-back pain, saw neurology, nerve conduction study 2/11 negative; pain was due to hip arthritis   Past Surgical History:  Procedure Laterality Date   CATARACT EXTRACTION W/ INTRAOCULAR LENS  IMPLANT, BILATERAL  2013   TONSILLECTOMY     TOTAL HIP ARTHROPLASTY     right, surgery [place] [facility],[place] [facility] 10/2009   left   Family History  Problem Relation Age of Onset   Breast cancer Mother    Diabetes Other        GM  Prostate cancer Father 28   Heart attack Neg Hx    Colon cancer Neg Hx    Social History   Socioeconomic History   Marital status: Married    Spouse name: Not on file   Number of children: 1   Years of education: Not on file   Highest education level: Not on file  Occupational History   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[employer]), retired Financial trader: RETIRED  Tobacco Use   Smoking status: Never   Smokeless tobacco: Never  Substance and Sexual Activity   Alcohol use: Yes    Alcohol/week: 0.0 standard drinks of alcohol     Comment: 6 oz red wine per night   Drug use: No   Sexual activity: Never  Other Topics Concern   Not on file  Social History Narrative   original from [place] , 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[employer]), retired Agricultural consultant    Married, 1 son who lives in [place], martial arts    Social Determinants of [place] Strain: [place]  (12/06/2020)   Overall Financial Resource Strain (CARDIA)    Difficulty of Paying Living Expenses: Not hard at all  Food Insecurity: No [place] (12/07/2021)   Hunger Vital Sign    Worried About Running Out of Food in the Last Year: Never true    [place] in the Last Year: Never true  Transportation Needs: No Transportation Needs (12/06/2020)   PRAPARE - Hydrologist (Medical): No    Lack of Transportation (Non-Medical): No  Physical Activity: Sufficiently Active (12/06/2020)   Exercise Vital Sign    Days of Exercise per Week: 4 days    Minutes of Exercise per Session: 40 min  Stress: No Stress Concern Present (12/06/2020)   [place]    Feeling of Stress : Not at all  Social Connections: Socially Isolated (12/06/2020)   Social Connection and Isolation Panel [NHANES]    Frequency of Communication with Friends and Family: Once a week    Frequency of Social Gatherings with Friends and Family: Never    Attends Religious Services: Never    Marine scientist or Organizations: No    Attends Music therapist: Never    Marital Status: Married    Tobacco Counseling Counseling given: Not Answered   Clinical Intake:  Pre-visit preparation completed: Yes  Pain : No/denies pain  Diabetes: No  How often do you need to have someone help you when you read instructions, pamphlets, or other written materials from your doctor or pharmacy?: 1 - Never   Activities of Daily Living    12/07/2021     3:43 PM  In your present state of health, do you have any difficulty performing the following activities:  Hearing? 1  Comment wears hearing aids  Vision? 0  Difficulty concentrating or making decisions? 0  Walking or climbing stairs? 0  Dressing or bathing? 0  Doing errands, shopping? 0  Preparing Food and eating ? N  Using the Toilet? N  In the past six months, have you accidently leaked urine? N  Do you have problems with loss of bowel control? N  Managing your Medications? N  Managing your Finances? N  Housekeeping or managing your Housekeeping? N    Patient Care Team: Colon Branch, MD as PCP - General Ladene Artist,  MD as Consulting Physician (Gastroenterology)  Indicate any recent Medical Services you may have received from other than [facility] providers in the past year (date may be approximate).     Assessment:   This is a routine wellness examination for Angelo.  Hearing/Vision screen No results found.  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 in the past year? 0 0 0 0 0  Number falls in past yr: 0 0 0 0 0  Injury with Fall? 0 0 0 0 0  Risk for fall due to : No Fall Risks      Follow up Falls evaluation completed Falls evaluation completed Falls evaluation completed Falls  prevention discussed Falls evaluation completed    Reinholds:  Any stairs in or around the home? No  If so, are there any without handrails?  No stairs Home free of loose throw rugs in walkways, pet beds, electrical cords, etc? Yes  Adequate lighting in your home to reduce risk of falls? Yes   ASSISTIVE DEVICES UTILIZED TO PREVENT FALLS:  Life alert? No  Use of a cane, walker or w/c? Yes  Grab bars in the bathroom? Yes  Shower chair or bench in shower? Yes  Elevated toilet seat or a handicapped toilet? No   TIMED UP AND GO:  Was the test performed?  No, audio visit .    Cognitive Function:    06/26/2017    2:17 PM  MMSE - Mini Mental State Exam  Orientation to time 5  Orientation to Place 5  Registration 3  Attention/ Calculation 5  Recall 2  Language- name 2 objects 2  Language- repeat 1  Language- follow 3 step command 3  Language- read & follow direction 1  Write a sentence 1  Copy design 1  Total score 29        12/07/2021    3:47 PM  6CIT Screen  What Year? 0 points  What month? 0 points  What time? 0 points  Count back from 20 0 points  Months in reverse 0 points  Repeat phrase 0 points  Total Score 0 points    Immunizations Immunization History  Administered Date(s) Administered   COVID-19, mRNA, vaccine(Comirnaty)12 years and older 12/05/2021   Fluad Quad(high Dose 65+) 11/19/2019, 11/19/2020, 10/24/2021   Influenza Split 11/14/2017   Influenza Whole 11/23/2006, 02/08/2009, 12/03/2011   Influenza, High Dose Seasonal PF 10/22/2018   Influenza-Unspecified 12/26/2013, 12/08/2014, 12/06/2016   PFIZER Comirnaty(Gray Top)Covid-19 Tri-Sucrose Vaccine 06/08/2020   PFIZER(Purple Top)SARS-COV-2 Vaccination 04/17/2019, 05/08/2019, 11/25/2019   Pfizer Covid-19 Vaccine Bivalent Booster 36yr &  up 12/03/2020   Pneumococcal Conjugate-13 05/23/2013   Pneumococcal Polysaccharide-23 12/07/2005, 06/26/2017   Td 04/09/2002    Tdap 05/09/2012   Zoster Recombinat (Shingrix) 09/06/2018, 03/14/2019   Zoster, Live 04/01/2008    TDAP status: Up to date  Flu Vaccine status: Up to date  Pneumococcal vaccine status: Up to date  Covid-19 vaccine status: Information provided on how to obtain vaccines.   Qualifies for Shingles Vaccine? Yes   Zostavax completed Yes   Shingrix Completed?: Yes  Screening Tests Health Maintenance  Topic Date Due   COVID-19 Vaccine (6 - Pfizer series) 04/05/2021   Medicare Annual Wellness (AWV)  12/06/2021   TETANUS/TDAP  05/10/2022   Pneumonia Vaccine 51+ Years old  Completed   INFLUENZA VACCINE  Completed   Zoster Vaccines- Shingrix  Completed   HPV VACCINES  Aged Out    Health Maintenance  Health Maintenance Due  Topic Date Due   COVID-19 Vaccine (6 - Pfizer series) 04/05/2021   Medicare Annual Wellness (AWV)  12/06/2021    Colorectal cancer screening: No longer required.   Lung Cancer Screening: (Low Dose CT Chest recommended if Age 20-80 years, 30 pack-year currently smoking OR have quit w/in 15years.) does not qualify.   Lung Cancer Screening Referral: N/a  Additional Screening:  Hepatitis C Screening: does not qualify  Vision Screening: Recommended annual ophthalmology exams for early detection of glaucoma and other disorders of the eye. Is the patient up to date with their annual eye exam?  No  Who is the provider or what is the name of the office in which the patient attends annual eye exams? Dr. Rosalva Ferron If pt is not established with a provider, would they like to be referred to a provider to establish care? No .   Dental Screening: Recommended annual dental exams for proper oral hygiene  Community Resource Referral / Chronic Care Management: CRR required this visit?  No   CCM required this visit?  No      Plan:     I have personally reviewed and noted the following in the patient's chart:   Medical and social history Use of alcohol,  tobacco or illicit drugs  Current medications and supplements including opioid prescriptions. Patient is not currently taking opioid prescriptions. Functional ability and status Nutritional status Physical activity Advanced directives List of other physicians Hospitalizations, surgeries, and ER visits in previous 12 months Vitals Screenings to include cognitive, depression, and falls Referrals and appointments  In addition, I have reviewed and discussed with patient certain preventive protocols, quality metrics, and best practice recommendations. A written personalized care plan for preventive services as well as general preventive health recommendations were provided to patient.   Due to this being a telephonic visit, the after visit summary with patients personalized plan was offered to patient via mail or my-chart. Per request, patient was mailed a copy of AVS.  Beatris Ship, [place]   12/07/2021   Nurse Notes: None

## 2022-01-16 ENCOUNTER — Other Ambulatory Visit (HOSPITAL_BASED_OUTPATIENT_CLINIC_OR_DEPARTMENT_OTHER): Payer: Self-pay

## 2022-01-16 MED ORDER — AREXVY 120 MCG/0.5ML IM SUSR
INTRAMUSCULAR | 0 refills | Status: DC
  Filled 2022-01-16: qty 0.5, 1d supply, fill #0

## 2022-02-06 ENCOUNTER — Other Ambulatory Visit: Payer: Self-pay | Admitting: Internal Medicine

## 2022-02-07 ENCOUNTER — Other Ambulatory Visit: Payer: Self-pay | Admitting: Internal Medicine

## 2022-04-24 ENCOUNTER — Ambulatory Visit: Payer: Medicare Other | Admitting: Internal Medicine

## 2022-04-25 ENCOUNTER — Ambulatory Visit (INDEPENDENT_AMBULATORY_CARE_PROVIDER_SITE_OTHER): Payer: Medicare Other | Admitting: Internal Medicine

## 2022-04-25 ENCOUNTER — Encounter: Payer: Self-pay | Admitting: Internal Medicine

## 2022-04-25 VITALS — BP 132/86 | HR 70 | Temp 98.0°F | Resp 18 | Ht 72.0 in | Wt 175.1 lb

## 2022-04-25 DIAGNOSIS — I10 Essential (primary) hypertension: Secondary | ICD-10-CM | POA: Diagnosis not present

## 2022-04-25 DIAGNOSIS — E785 Hyperlipidemia, unspecified: Secondary | ICD-10-CM | POA: Diagnosis not present

## 2022-04-25 NOTE — Progress Notes (Signed)
   Subjective:    Patient ID: Chase Hernandez, male    DOB: 08-May-1933, 89 y.o.   MRN: [id number]  DOS:  04/25/2022 Type of visit - description: f/u  Since the last office visit is doing well. Is trying to remain active. No chest pain or difficulty breathing No GERD symptoms. Ambulatory BPs are good: 119/65, 112/72, 118/64.  Heart rate in the 80s.   Review of Systems See above   Past Medical History:  Diagnosis Date   Anemia    Barrett's esophagus    Colon polyps 08/13/2012   tubular adenomas   Hyperlipidemia    Hypertension    Kidney stones 2012   h/o   LBBB (left bundle branch block)    Neck fracture ([facility]) 04-2010   Dr Yetta Barre, conservative treatnment   Osteoarthritis    severe hip-back pain, saw neurology, nerve conduction study 2/11 negative; pain was due to hip arthritis    Past Surgical History:  Procedure Laterality Date   CATARACT EXTRACTION W/ INTRAOCULAR LENS  IMPLANT, BILATERAL  2013   TONSILLECTOMY     TOTAL HIP ARTHROPLASTY     right, surgery April 5,2011 @ [facility]   TOTAL HIP ARTHROPLASTY  10/2009   left    Current Outpatient Medications  Medication Instructions   atenolol-chlorthalidone (TENORETIC) 50-25 MG tablet 0.5 tablets, Oral, Daily   GLUCOSAMINE HCL PO Daily   Multiple Vitamins-Minerals (ZINC PO) Oral,     pantoprazole (PROTONIX) 20 mg, Oral, Daily before breakfast   potassium chloride (KLOR-CON) 10 MEQ tablet 10 mEq, Oral, Daily       Objective:   Physical Exam BP 132/86   Pulse 70   Temp 98 F (36.7 C) (Oral)   Resp 18   Ht 6' (1.829 m)   Wt 175 lb 2 oz (79.4 kg)   SpO2 98%   BMI 23.75 kg/m  General:   Well developed, NAD, BMI noted. HEENT:  Normocephalic . Face symmetric, atraumatic Lungs:  CTA B Normal respiratory effort, no intercostal retractions, no accessory muscle use. Heart: RRR,  no murmur.  Lower extremities: no pretibial edema bilaterally  Skin: Not pale. Not jaundice Neurologic:  alert & oriented X3.   Speech normal, gait appropriate for age.  Uses a cane. Psych--  Cognition and judgment appear intact.  Cooperative with normal attention span and concentration.  Behavior appropriate. No anxious or depressed appearing.      Assessment     Assessment HTN Hyperlipidemia DJD: See surgeries Neck fracture 2012, Dr. Yetta Barre, conservative treatment H/o Kidney stones 2012 Barrett's esophagus H/o anemia: cscope , EGD done 06-2014 : colon polyps, erosive esophagitis and gastritis,+ Barrett's--- f/u prn, no further surveillance scope,  rx PPIs low-dose/long-term  PLAN: HTN: Normal ambulatory BPs, continue Tenoretic, potassium.  Continue checking BPs, check a BMP and CBC. Hyperlipidemia: Diet controlled, last FLP was great.  he continues to do well, follows a healthy diet and he remains active. Preventive care: Had a COVID-vaccine 12/05/2021.  Up-to-date. Social: Lives with his wife, they have been married for more than 40 years, they are very happy.  He is still do some exercise (tai chi). RTC 6 months

## 2022-04-25 NOTE — Patient Instructions (Addendum)
It was good to see you today.  Happy Birthday!   Check the  blood pressure regularly BP GOAL is between 110/65 and  135/85. If it is consistently higher or lower, let me know     GO TO THE LAB : Get the blood work     [place], Chase Hernandez back for   checkup in 6 months

## 2022-04-26 LAB — BASIC METABOLIC PANEL
BUN: 16 mg/dL (ref 6–23)
CO2: 31 mEq/L (ref 19–32)
Calcium: 9.9 mg/dL (ref 8.4–10.5)
Chloride: 91 mEq/L — ABNORMAL LOW (ref 96–112)
Creatinine, Ser: 0.84 mg/dL (ref 0.40–1.50)
GFR: 78.69 mL/min (ref >60.00)
Glucose, Bld: 91 mg/dL (ref 70–99)
Potassium: 3.6 mEq/L (ref 3.5–5.1)
Sodium: 134 mEq/L — ABNORMAL LOW (ref 135–145)

## 2022-04-26 LAB — CBC WITH DIFFERENTIAL/PLATELET
Basophils Absolute: 0.1 10*3/uL (ref 0.0–0.1)
Basophils Relative: 1.3 % (ref 0.0–3.0)
Eosinophils Absolute: 0.3 10*3/uL (ref 0.0–0.7)
Eosinophils Relative: 2.9 % (ref 0.0–5.0)
HCT: 43.2 % (ref 39.0–52.0)
Hemoglobin: 15.2 g/dL (ref 13.0–17.0)
Lymphocytes Relative: 16.1 % (ref 12.0–46.0)
Lymphs Abs: 1.6 10*3/uL (ref 0.7–4.0)
MCHC: 35.3 g/dL (ref 30.0–36.0)
MCV: 98 fl (ref 78.0–100.0)
Monocytes Absolute: 0.9 10*3/uL (ref 0.1–1.0)
Monocytes Relative: 9.2 % (ref 3.0–12.0)
Neutro Abs: 7 10*3/uL (ref 1.4–7.7)
Neutrophils Relative %: 70.5 % (ref 43.0–77.0)
Platelets: 356 10*3/uL (ref 150.0–400.0)
RBC: 4.4 Mil/uL (ref 4.22–5.81)
RDW: 12.7 % (ref 11.5–15.5)
WBC: 9.9 10*3/uL (ref 4.0–10.5)

## 2022-04-26 NOTE — Assessment & Plan Note (Signed)
HTN: Normal ambulatory BPs, continue Tenoretic, potassium.  Continue checking BPs, check a BMP and CBC. Hyperlipidemia: Diet controlled, last FLP was great.  he continues to do well, follows a healthy diet and he remains active. Preventive care: Had a COVID-vaccine 12/05/2021.  Up-to-date. Social: Lives with his wife, they have been married for more than 40 years, they are very happy.  He is still do some exercise (tai chi). RTC 6 months

## 2022-06-12 ENCOUNTER — Telehealth: Payer: Self-pay | Admitting: Internal Medicine

## 2022-06-12 DIAGNOSIS — E785 Hyperlipidemia, unspecified: Secondary | ICD-10-CM

## 2022-06-12 MED ORDER — ATORVASTATIN CALCIUM 10 MG PO TABS: 10.0000 mg | ORAL_TABLET | Freq: Every day | ORAL | 0 refills | Status: DC

## 2022-06-12 NOTE — Telephone Encounter (Signed)
In the past with Multiple Benefits of the Statin. Plan: Atorvastatin 10 mg 1 p.o. nightly, 15-month supply.  FLP AST ALT in 6 weeks.

## 2022-06-12 NOTE — Telephone Encounter (Signed)
Please advise 

## 2022-06-12 NOTE — Telephone Encounter (Signed)
Patient states he would like to start the cholesterol medication offered to him on his last OV. Please advise.

## 2022-07-28 ENCOUNTER — Other Ambulatory Visit (INDEPENDENT_AMBULATORY_CARE_PROVIDER_SITE_OTHER): Payer: Medicare Other

## 2022-07-28 DIAGNOSIS — E785 Hyperlipidemia, unspecified: Secondary | ICD-10-CM | POA: Diagnosis not present

## 2022-07-28 LAB — LIPID PANEL
Cholesterol: 128 mg/dL (ref <200)
HDL: 61 mg/dL (ref >=40)
LDL Cholesterol (Calc): 50 mg/dL (calc)
Non-HDL Cholesterol (Calc): 67 mg/dL (calc) (ref <130)
Total CHOL/HDL Ratio: 2.1 (calc) (ref <5.0)
Triglycerides: 83 mg/dL (ref <150)

## 2022-07-28 LAB — AST: AST: 19 U/L (ref 10–35)

## 2022-07-28 LAB — ALT: ALT: 24 U/L (ref 9–46)

## 2022-07-31 MED ORDER — ATORVASTATIN CALCIUM 10 MG PO TABS: 10.0000 mg | ORAL_TABLET | Freq: Every day | ORAL | 3 refills | Status: DC

## 2022-07-31 NOTE — Addendum Note (Signed)
Addended byConrad [place] D on: 07/31/2022 07:49 AM   Modules accepted: Orders

## 2022-08-07 ENCOUNTER — Other Ambulatory Visit: Payer: Self-pay | Admitting: Internal Medicine

## 2022-08-10 ENCOUNTER — Other Ambulatory Visit: Payer: Self-pay | Admitting: Internal Medicine

## 2022-09-21 ENCOUNTER — Encounter (INDEPENDENT_AMBULATORY_CARE_PROVIDER_SITE_OTHER): Payer: Self-pay

## 2022-10-27 ENCOUNTER — Ambulatory Visit: Payer: Medicare Other | Admitting: Internal Medicine

## 2022-10-27 ENCOUNTER — Encounter: Payer: Self-pay | Admitting: Internal Medicine

## 2022-10-27 ENCOUNTER — Ambulatory Visit (INDEPENDENT_AMBULATORY_CARE_PROVIDER_SITE_OTHER): Payer: Medicare Other | Admitting: Internal Medicine

## 2022-10-27 VITALS — BP 134/70 | HR 80 | Temp 97.8°F | Resp 16 | Ht 72.0 in | Wt 177.0 lb

## 2022-10-27 DIAGNOSIS — Z09 Encounter for follow-up examination after completed treatment for conditions other than malignant neoplasm: Secondary | ICD-10-CM

## 2022-10-27 DIAGNOSIS — R269 Unspecified abnormalities of gait and mobility: Secondary | ICD-10-CM | POA: Diagnosis not present

## 2022-10-27 DIAGNOSIS — I10 Essential (primary) hypertension: Secondary | ICD-10-CM

## 2022-10-27 DIAGNOSIS — Z23 Encounter for immunization: Secondary | ICD-10-CM | POA: Diagnosis not present

## 2022-10-27 NOTE — Progress Notes (Unsigned)
Subjective:    Patient ID: Chase Hernandez, male    DOB: 1933-03-31, 89 y.o.   MRN: [id number]  DOS:  10/27/2022 Type of visit - description: f/u  Here for follow-up. Doing well. Denied chest pain or difficulty breathing. No dysphagia, odynophagia.  No GERD symptoms. Had a fall August 06, 2022, a cart in the supermarket got stuck and he fell.  No major injury.  Review of Systems See above   Past Medical History:  Diagnosis Date   Anemia    Barrett's esophagus    Colon polyps 08/13/2012   tubular adenomas   Hyperlipidemia    Hypertension    Kidney stones 2012   h/o   LBBB (left bundle branch block)    Neck fracture (HCC) 04-2010   Dr Yetta Barre, conservative treatnment   Osteoarthritis    severe hip-back pain, saw neurology, nerve conduction study 2/11 negative; pain was due to hip arthritis    Past Surgical History:  Procedure Laterality Date   CATARACT EXTRACTION W/ INTRAOCULAR LENS  IMPLANT, BILATERAL  2013   TONSILLECTOMY     TOTAL HIP ARTHROPLASTY     right, surgery April 5,2011 @ [facility]   TOTAL HIP ARTHROPLASTY  10/2009   left    Current Outpatient Medications  Medication Instructions   atenolol-chlorthalidone (TENORETIC) 50-25 MG tablet 0.5 tablets, Oral, Daily   atorvastatin (LIPITOR) 10 mg, Oral, Daily at bedtime   GLUCOSAMINE HCL PO Daily   Multiple Vitamins-Minerals (ZINC PO) Oral,     pantoprazole (PROTONIX) 20 mg, Oral, Daily before breakfast   potassium chloride (KLOR-CON) 10 MEQ tablet 10 mEq, Oral, Daily       Objective:   Physical Exam BP 134/70   Pulse 80   Temp 97.8 F (36.6 C) (Oral)   Resp 16   Ht 6' (1.829 m)   Wt 177 lb (80.3 kg)   SpO2 97%   BMI 24.01 kg/m  General:   Well developed, NAD, BMI noted. HEENT:  Normocephalic . Face symmetric, atraumatic Lungs:  CTA B Normal respiratory effort, no intercostal retractions, no accessory muscle use. Heart: RRR,  no murmur.  Lower extremities: no pretibial edema bilaterally   Skin: Not pale. Not jaundice Neurologic:  alert & oriented X3.  Speech normal, gait tested by a cane. Psych--  Cognition and judgment appear intact.  Cooperative with normal attention span and concentration.  Behavior appropriate. No anxious or depressed appearing.      Assessment    Assessment HTN Hyperlipidemia DJD: See surgeries Neck fracture 2012, Dr. Yetta Barre, conservative treatment H/o Kidney stones 2012 Barrett's esophagus H/o anemia: cscope , EGD done 06-2014 : colon polyps, erosive esophagitis and gastritis,+ Barrett's--- f/u prn, no further surveillance scope,  rx PPIs low-dose/long-term  PLAN: HTN: BP today is very good, ambulatory BP in the 110s, heart rate in the 70s, 80s.  Continue Tenoretic, KCl.  Check BMP. Dyslipidemia: Well-controlled on atorvastatin. Gait disorder: Had a fall June 2024, no major injury.  Physical therapy?  Declined, states it was just an accident, he remains active doing martial arts and uses a cane consistently. Preventive care reviewed: -Td: 2014 - PNM 23: 2007 and 2019 - prevnar: 2013 - PNM 20: Today - s/p Zostavax and  shingrix - Had RSV. - flu shot today -Vaccines I recommend: Tdap, COVID  - No further colon or prostate cancer screening. - Healthcare POA: Charted RTC 4 to 5 months

## 2022-10-27 NOTE — Patient Instructions (Addendum)
Vaccines I recommend:  Tdap (tetanus) Covid booster- new this fall  Continue checking your blood pressure regularly Blood pressure goal:  between 110/65 and  135/85. If it is consistently higher or lower, let me know     GO TO THE LAB : Get the blood work     Next visit with me in 4 to 5 months for a routine checkup. Please schedule it at the front desk

## 2022-10-28 LAB — BASIC METABOLIC PANEL
BUN: 13 mg/dL (ref 7–25)
CO2: 30 mmol/L (ref 20–32)
Calcium: 9.9 mg/dL (ref 8.6–10.3)
Chloride: 94 mmol/L — ABNORMAL LOW (ref 98–110)
Creat: 0.85 mg/dL (ref 0.70–1.22)
Glucose, Bld: 105 mg/dL — ABNORMAL HIGH (ref 65–99)
Potassium: 4 mmol/L (ref 3.5–5.3)
Sodium: 134 mmol/L — ABNORMAL LOW (ref 135–146)

## 2022-10-28 NOTE — Assessment & Plan Note (Signed)
HTN: BP today is very good, ambulatory BP in the 110s, heart rate in the 70s, 80s.  Continue Tenoretic, KCl.  Check BMP. Dyslipidemia: Well-controlled on atorvastatin. Gait disorder: Had a fall June 2024, no major injury.  Physical therapy?  Declined, states it was just an accident, he remains active doing martial arts and uses a cane consistently. Preventive care reviewed RTC 4 to 5 months

## 2022-10-28 NOTE — Assessment & Plan Note (Signed)
Preventive care reviewed: -Td: 2014 - PNM 23: 2007 and 2019 - prevnar: 2013 - PNM 20: Today - s/p Zostavax and  shingrix - Had RSV. - flu shot today -Vaccines I recommend: Tdap, COVID  - No further colon or prostate cancer screening. - Healthcare POA: Charted

## 2022-12-19 ENCOUNTER — Ambulatory Visit (INDEPENDENT_AMBULATORY_CARE_PROVIDER_SITE_OTHER): Payer: Medicare Other

## 2022-12-19 DIAGNOSIS — Z0000 Encounter for general adult medical examination without abnormal findings: Secondary | ICD-10-CM | POA: Diagnosis not present

## 2022-12-19 NOTE — Progress Notes (Signed)
Subjective:   Chase Hernandez is a 89 y.o. male who presents for Medicare Annual/Subsequent preventive examination.  Visit Complete: Virtual I connected with  Chase Hernandez on 12/19/22 by a audio enabled telemedicine application and verified that I am speaking with the correct person using two identifiers.  Patient Location: Home  Provider Location: Office/Clinic  I discussed the limitations of evaluation and management by telemedicine. The patient expressed understanding and agreed to proceed.  Vital Signs: Because this visit was a virtual/telehealth visit, some criteria may be missing or patient reported. Any vitals not documented were not able to be obtained and vitals that have been documented are patient reported.   Cardiac Risk Factors include: advanced age (>76men, >33 women);male gender;dyslipidemia;hypertension     Objective:    There were no vitals filed for this visit. There is no height or weight on file to calculate BMI.     12/19/2022    3:14 PM 12/07/2021    3:42 PM 12/06/2020    3:10 PM 10/14/2019    2:23 PM 09/03/2018    2:16 PM 06/26/2017    2:12 PM 06/23/2016    2:40 PM  Advanced Directives  Does Patient Have a Medical Advance Directive? Yes Yes Yes Yes Yes Yes Yes  Type of Estate agent of [place];Living will Healthcare Power of [place];Living will Healthcare Power of [place];Living will Healthcare Power of [place];Living will Healthcare Power of [place];Living will Healthcare Power of [place];Living will Healthcare Power of [place];Living will  Does patient want to make changes to medical advance directive? No - Patient declined No - Patient declined  No - Patient declined No - Patient declined No - Patient declined No - Patient declined  Copy of Healthcare Power of Attorney in Chart? No - copy requested Yes - validated most recent copy scanned in chart (See row information) Yes - validated most recent copy scanned in chart (See  row information) No - copy requested No - copy requested No - copy requested No - copy requested    Current Medications (verified) Outpatient Encounter Medications as of 12/19/2022  Medication Sig   atenolol-chlorthalidone (TENORETIC) 50-25 MG tablet Take 0.5 tablets by mouth daily.   atorvastatin (LIPITOR) 10 MG tablet Take 1 tablet (10 mg total) by mouth at bedtime.   GLUCOSAMINE HCL PO Take by mouth daily.   Multiple Vitamins-Minerals (ZINC PO) Take by mouth.   pantoprazole (PROTONIX) 20 MG tablet Take 1 tablet (20 mg total) by mouth daily before breakfast.   potassium chloride (KLOR-CON) 10 MEQ tablet Take 1 tablet (10 mEq total) by mouth daily.   No facility-administered encounter medications on file as of 12/19/2022.    Allergies (verified) Oxycodone   History: Past Medical History:  Diagnosis Date   Anemia    Barrett's esophagus    Colon polyps 08/13/2012   tubular adenomas   Hyperlipidemia    Hypertension    Kidney stones 2012   h/o   LBBB (left bundle branch block)    Neck fracture (HCC) 04-2010   Dr Yetta Barre, conservative treatnment   Osteoarthritis    severe hip-back pain, saw neurology, nerve conduction study 2/11 negative; pain was due to hip arthritis   Past Surgical History:  Procedure Laterality Date   CATARACT EXTRACTION W/ INTRAOCULAR LENS  IMPLANT, BILATERAL  2013   TONSILLECTOMY     TOTAL HIP ARTHROPLASTY     right, surgery April 5,2011 @ [facility]   TOTAL HIP ARTHROPLASTY  10/2009   left  Family History  Problem Relation Age of Onset   Breast cancer Mother    Diabetes Other        GM   Prostate cancer Father 51   Heart attack Neg Hx    Colon cancer Neg Hx    Social History   Socioeconomic History   Marital status: Married    Spouse name: Not on file   Number of children: 1   Years of education: Not on file   Highest education level: Not on file  Occupational History   Occupation: Former Psychologist, 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[employer]), retired  Loss adjuster, chartered: RETIRED  Tobacco Use   Smoking status: Never   Smokeless tobacco: Never  Substance and Sexual Activity   Alcohol use: Yes    Alcohol/week: 0.0 standard drinks of alcohol    Comment: 6 oz red wine per night   Drug use: No   Sexual activity: Never  Other Topics Concern   Not on file  Social History Narrative   original from [place] , 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[employer]), retired Insurance underwriter    Married, 1 son who lives in [place] belt, martial arts    Social Determinants of Health   Financial Resource Strain: Low Risk  (12/19/2022)   Overall Financial Resource Strain (CARDIA)    Difficulty of Paying Living Expenses: Not hard at all  Food Insecurity: No Food Insecurity (12/19/2022)   Hunger Vital Sign    Worried About Running Out of Food in the Last Year: Never true    Ran Out of Food in the Last Year: Never true  Transportation Needs: No Transportation Needs (12/19/2022)   PRAPARE - Administrator, Civil Service (Medical): No    Lack of Transportation (Non-Medical): No  Physical Activity: Insufficiently Active (12/19/2022)   Exercise Vital Sign    Days of Exercise per Week: 3 days    Minutes of Exercise per Session: 40 min  Stress: No Stress Concern Present (12/19/2022)   [employer] of Occupational Health - Occupational Stress Questionnaire    Feeling of Stress : Not at all  Social Connections: Socially Isolated (12/19/2022)   Social Connection and Isolation Panel [NHANES]    Frequency of Communication with Friends and Family: Once a week    Frequency of Social Gatherings with Friends and Family: Once a week    Attends Religious Services: Never    Database administrator or Organizations: No    Attends Engineer, structural: Never    Marital Status: Married    Tobacco Counseling Counseling given: Not Answered   Clinical Intake:  Pre-visit preparation completed: Yes  Pain : No/denies  pain  Nutritional Risks: None Diabetes: No  How often do you need to have someone help you when you read instructions, pamphlets, or other written materials from your doctor or pharmacy?: 1 - Never  Interpreter Needed?: No  Information entered by :: [employer], CMA   Activities of Daily Living    12/19/2022    3:04 PM  In your present state of health, do you have any difficulty performing the following activities:  Hearing? 1  Comment wears hearing aids  Vision? 0  Difficulty concentrating or making decisions? 1  Comment remembering  Walking or climbing stairs? 1  Dressing or bathing? 0  Doing errands, shopping? 1  Comment wife Insurance claims handler and eating ? N  Using the Toilet? N  In the past six  months, have you accidently leaked urine? N  Do you have problems with loss of bowel control? N  Managing your Medications? N  Managing your Finances? N  Housekeeping or managing your Housekeeping? N    Patient Care Team: Wanda Plump, MD as PCP - General Meryl Dare, MD as Consulting Physician (Gastroenterology)  Indicate any recent Medical Services you may have received from other than [facility] providers in the past year (date may be approximate).     Assessment:   This is a routine wellness examination for Chase Hernandez.  Hearing/Vision screen No results found.   Goals Addressed   None    Depression Screen    12/19/2022    3:10 PM 10/27/2022    3:48 PM 04/25/2022    3:43 PM 12/07/2021    3:42 PM 10/24/2021    2:02 PM 05/23/2021    2:19 PM 12/06/2020    3:14 PM  PHQ 2/9 Scores  PHQ - 2 Score 0 0 0 0 0 0 0    Fall Risk    12/19/2022    3:07 PM 10/27/2022    3:48 PM 04/25/2022    3:43 PM 12/07/2021    3:42 PM 10/24/2021    2:02 PM  Fall Risk   Falls in the past year? 1 0 0 0 0  Number falls in past yr: 0 0 0 0 0  Injury with Fall? 0 0 0 0 0  Risk for fall due to : Impaired balance/gait   No Fall Risks   Follow up Falls evaluation completed Falls  evaluation completed Falls evaluation completed Falls evaluation completed Falls evaluation completed    MEDICARE RISK AT HOME: Medicare Risk at Home Any stairs in or around the home?: No If so, are there any without handrails?: No Home free of loose throw rugs in walkways, pet beds, electrical cords, etc?: Yes Adequate lighting in your home to reduce risk of falls?: Yes Life alert?: No Use of a cane, walker or w/c?: Yes Grab bars in the bathroom?: No Shower chair or bench in shower?: Yes Elevated toilet seat or a handicapped toilet?: No  TIMED UP AND GO:  Was the test performed?  No    Cognitive Function:    06/26/2017    2:17 PM  MMSE - Mini Mental State Exam  Orientation to time 5  Orientation to Place 5  Registration 3  Attention/ Calculation 5  Recall 2  Language- name 2 objects 2  Language- repeat 1  Language- follow 3 step command 3  Language- read & follow direction 1  Write a sentence 1  Copy design 1  Total score 29        12/19/2022    3:14 PM 12/07/2021    3:47 PM  6CIT Screen  What Year? 0 points 0 points  What month? 0 points 0 points  What time? 0 points 0 points  Count back from 20 0 points 0 points  Months in reverse 0 points 0 points  Repeat phrase 0 points 0 points  Total Score 0 points 0 points    Immunizations Immunization History  Administered Date(s) Administered   Fluad Quad(high Dose 65+) 11/19/2019, 11/19/2020, 10/24/2021   Fluad Trivalent(High Dose 65+) 10/27/2022   Influenza Split 11/14/2017   Influenza Whole 11/23/2006, 02/08/2009, 12/03/2011   Influenza, High Dose Seasonal PF 10/22/2018   Influenza-Unspecified 12/26/2013, 12/08/2014, 12/06/2016   PFIZER Comirnaty(Gray Top)Covid-19 Tri-Sucrose Vaccine 06/08/2020   PFIZER(Purple Top)SARS-COV-2 Vaccination 04/17/2019, 05/08/2019, 11/25/2019   PNEUMOCOCCAL  CONJUGATE-20 10/27/2022   Pfizer Covid-19 Theatre manager 30yrs & up 12/03/2020   Pfizer(Comirnaty)Fall Seasonal  Vaccine 12 years and older 12/05/2021   Pneumococcal Conjugate-13 05/23/2013   Pneumococcal Polysaccharide-23 12/07/2005, 06/26/2017   Respiratory Syncytial Virus Vaccine,Recomb Aduvanted(Arexvy) 01/16/2022   Td 04/09/2002   Tdap 05/09/2012   Zoster Recombinant(Shingrix) 09/06/2018, 03/14/2019   Zoster, Live 04/01/2008    TDAP status: Due, Education has been provided regarding the importance of this vaccine. Advised may receive this vaccine at local pharmacy or Health Dept. Aware to provide a copy of the vaccination record if obtained from local pharmacy or Health Dept. Verbalized acceptance and understanding.  Flu Vaccine status: Up to date  Pneumococcal vaccine status: Up to date  Covid-19 vaccine status: Information provided on how to obtain vaccines.   Qualifies for Shingles Vaccine? Yes   Zostavax completed Yes   Shingrix Completed?: Yes  Screening Tests Health Maintenance  Topic Date Due   DTaP/Tdap/Td (3 - Td or Tdap) 05/10/2022   COVID-19 Vaccine (7 - 2023-24 season) 10/08/2022   Medicare Annual Wellness (AWV)  12/08/2022   Pneumonia Vaccine 16+ Years old  Completed   INFLUENZA VACCINE  Completed   Zoster Vaccines- Shingrix  Completed   HPV VACCINES  Aged Out    Health Maintenance  Health Maintenance Due  Topic Date Due   DTaP/Tdap/Td (3 - Td or Tdap) 05/10/2022   COVID-19 Vaccine (7 - 2023-24 season) 10/08/2022   Medicare Annual Wellness (AWV)  12/08/2022    Colorectal cancer screening: No longer required.   Lung Cancer Screening: (Low Dose CT Chest recommended if Age 84-80 years, 20 pack-year currently smoking OR have quit w/in 15years.) does not qualify.   Additional Screening:  Hepatitis C Screening: does not qualify  Vision Screening: Recommended annual ophthalmology exams for early detection of glaucoma and other disorders of the eye. Is the patient up to date with their annual eye exam?  Yes  Who is the provider or what is the name of the office in  which the patient attends annual eye exams? Dr. Ashley Royalty If pt is not established with a provider, would they like to be referred to a provider to establish care? No .   Dental Screening: Recommended annual dental exams for proper oral hygiene  Diabetic Foot Exam: N/a  Community Resource Referral / Chronic Care Management: CRR required this visit?  No   CCM required this visit?  No     Plan:     I have personally reviewed and noted the following in the patient's chart:   Medical and social history Use of alcohol, tobacco or illicit drugs  Current medications and supplements including opioid prescriptions. Patient is not currently taking opioid prescriptions. Functional ability and status Nutritional status Physical activity Advanced directives List of other physicians Hospitalizations, surgeries, and ER visits in previous 12 months Vitals Screenings to include cognitive, depression, and falls Referrals and appointments  In addition, I have reviewed and discussed with patient certain preventive protocols, quality metrics, and best practice recommendations. A written personalized care plan for preventive services as well as general preventive health recommendations were provided to patient.     Donne Anon, CMA   12/19/2022   After Visit Summary: (MyChart) Due to this being a telephonic visit, the after visit summary with patients personalized plan was offered to patient via MyChart   Nurse Notes: None

## 2022-12-19 NOTE — Patient Instructions (Signed)
[name] , Thank you for taking time to come for your Medicare Wellness Visit. I appreciate your ongoing commitment to your health goals. Please review the following plan we discussed and let me know if I can assist you in the future.    This is a list of the screening recommended for you and due dates:  Health Maintenance  Topic Date Due   DTaP/Tdap/Td vaccine (3 - Td or Tdap) 05/10/2022   COVID-19 Vaccine (7 - 2023-24 season) 10/08/2022   Medicare Annual Wellness Visit  12/19/2023   Pneumonia Vaccine  Completed   Flu Shot  Completed   Zoster (Shingles) Vaccine  Completed   HPV Vaccine  Aged Out    Next appointment: Follow up in one year for your annual wellness visit.   Preventive Care 87 Years and Older, Male Preventive care refers to lifestyle choices and visits with your health care provider that can promote health and wellness. What does preventive care include? A yearly physical exam. This is also called an annual well check. Dental exams once or twice a year. Routine eye exams. Ask your health care provider how often you should have your eyes checked. Personal lifestyle choices, including: Daily care of your teeth and gums. Regular physical activity. Eating a healthy diet. Avoiding tobacco and drug use. Limiting alcohol use. Practicing safe sex. Taking low doses of aspirin every day. Taking vitamin and mineral supplements as recommended by your health care provider. What happens during an annual well check? The services and screenings done by your health care provider during your annual well check will depend on your age, overall health, lifestyle risk factors, and family history of disease. Counseling  Your health care provider may ask you questions about your: Alcohol use. Tobacco use. Drug use. Emotional well-being. Home and relationship well-being. Sexual activity. Eating habits. History of falls. Memory and ability to understand (cognition). Work and  work Astronomer. Screening  You may have the following tests or measurements: Height, weight, and BMI. Blood pressure. Lipid and cholesterol levels. These may be checked every 5 years, or more frequently if you are over 87 years old. Skin check. Lung cancer screening. You may have this screening every year starting at age 87 if you have a 30-pack-year history of smoking and currently smoke or have quit within the past 15 years. Fecal occult blood test (FOBT) of the stool. You may have this test every year starting at age 87. Flexible sigmoidoscopy or colonoscopy. You may have a sigmoidoscopy every 5 years or a colonoscopy every 10 years starting at age 87. Prostate cancer screening. Recommendations will vary depending on your family history and other risks. Hepatitis C blood test. Hepatitis B blood test. Sexually transmitted disease (STD) testing. Diabetes screening. This is done by checking your blood sugar (glucose) after you have not eaten for a while (fasting). You may have this done every 1-3 years. Abdominal aortic aneurysm (AAA) screening. You may need this if you are a current or former smoker. Osteoporosis. You may be screened starting at age 87 if you are at high risk. Talk with your health care provider about your test results, treatment options, and if necessary, the need for more tests. Vaccines  Your health care provider may recommend certain vaccines, such as: Influenza vaccine. This is recommended every year. Tetanus, diphtheria, and acellular pertussis (Tdap, Td) vaccine. You may need a Td booster every 10 years. Zoster vaccine. You may need this after age 87. Pneumococcal 13-valent conjugate (PCV13) vaccine. One  dose is recommended after age 87. Pneumococcal polysaccharide (PPSV23) vaccine. One dose is recommended after age 87. Talk to your health care provider about which screenings and vaccines you need and how often you need them. This information is not intended to  replace advice given to you by your health care provider. Make sure you discuss any questions you have with your health care provider. Document Released: 02/19/2015 Document Revised: 10/13/2015 Document Reviewed: 11/24/2014 Elsevier Interactive Patient Education  2017 [employer].  Fall Prevention in the Home Falls can cause injuries. They can happen to people of all ages. There are many things you can do to make your home safe and to help prevent falls. What can I do on the outside of my home? Regularly fix the edges of walkways and driveways and fix any cracks. Remove anything that might make you trip as you walk through a door, such as a raised step or threshold. Trim any bushes or trees on the path to your home. Use bright outdoor lighting. Clear any walking paths of anything that might make someone trip, such as rocks or tools. Regularly check to see if handrails are loose or broken. Make sure that both sides of any steps have handrails. Any raised decks and porches should have guardrails on the edges. Have any leaves, snow, or ice cleared regularly. Use sand or salt on walking paths during winter. Clean up any spills in your garage right away. This includes oil or grease spills. What can I do in the bathroom? Use night lights. Install grab bars by the toilet and in the tub and shower. Do not use towel bars as grab bars. Use non-skid mats or decals in the tub or shower. If you need to sit down in the shower, use a plastic, non-slip stool. Keep the floor dry. Clean up any water that spills on the floor as soon as it happens. Remove soap buildup in the tub or shower regularly. Attach bath mats securely with double-sided non-slip rug tape. Do not have throw rugs and other things on the floor that can make you trip. What can I do in the bedroom? Use night lights. Make sure that you have a light by your bed that is easy to reach. Do not use any sheets or blankets that are too big for  your bed. They should not hang down onto the floor. Have a firm chair that has side arms. You can use this for support while you get dressed. Do not have throw rugs and other things on the floor that can make you trip. What can I do in the kitchen? Clean up any spills right away. Avoid walking on wet floors. Keep items that you use a lot in easy-to-reach places. If you need to reach something above you, use a strong step stool that has a grab bar. Keep electrical cords out of the way. Do not use floor polish or wax that makes floors slippery. If you must use wax, use non-skid floor wax. Do not have throw rugs and other things on the floor that can make you trip. What can I do with my stairs? Do not leave any items on the stairs. Make sure that there are handrails on both sides of the stairs and use them. Fix handrails that are broken or loose. Make sure that handrails are as long as the stairways. Check any carpeting to make sure that it is firmly attached to the stairs. Fix any carpet that is loose or worn.  Avoid having throw rugs at the top or bottom of the stairs. If you do have throw rugs, attach them to the floor with carpet tape. Make sure that you have a light switch at the top of the stairs and the bottom of the stairs. If you do not have them, ask someone to add them for you. What else can I do to help prevent falls? Wear shoes that: Do not have high heels. Have rubber bottoms. Are comfortable and fit you well. Are closed at the toe. Do not wear sandals. If you use a stepladder: Make sure that it is fully opened. Do not climb a closed stepladder. Make sure that both sides of the stepladder are locked into place. Ask someone to hold it for you, if possible. Clearly mark and make sure that you can see: Any grab bars or handrails. First and last steps. Where the edge of each step is. Use tools that help you move around (mobility aids) if they are needed. These  include: Canes. Walkers. Scooters. Crutches. Turn on the lights when you go into a dark area. Replace any light bulbs as soon as they burn out. Set up your furniture so you have a clear path. Avoid moving your furniture around. If any of your floors are uneven, fix them. If there are any pets around you, be aware of where they are. Review your medicines with your doctor. Some medicines can make you feel dizzy. This can increase your chance of falling. Ask your doctor what other things that you can do to help prevent falls. This information is not intended to replace advice given to you by your health care provider. Make sure you discuss any questions you have with your health care provider. Document Released: 11/19/2008 Document Revised: 07/01/2015 Document Reviewed: 02/27/2014 Elsevier Interactive Patient Education  2017 [employer].

## 2023-02-04 ENCOUNTER — Other Ambulatory Visit: Payer: Self-pay | Admitting: Internal Medicine

## 2023-03-02 DIAGNOSIS — Z23 Encounter for immunization: Secondary | ICD-10-CM | POA: Diagnosis not present

## 2023-03-30 ENCOUNTER — Telehealth: Payer: Self-pay

## 2023-03-30 ENCOUNTER — Ambulatory Visit (INDEPENDENT_AMBULATORY_CARE_PROVIDER_SITE_OTHER): Payer: Medicare Other | Admitting: Internal Medicine

## 2023-03-30 ENCOUNTER — Encounter: Payer: Self-pay | Admitting: Internal Medicine

## 2023-03-30 VITALS — BP 116/84 | HR 48 | Temp 98.2°F | Resp 16 | Ht 72.0 in | Wt 173.0 lb

## 2023-03-30 DIAGNOSIS — I4891 Unspecified atrial fibrillation: Secondary | ICD-10-CM | POA: Diagnosis not present

## 2023-03-30 DIAGNOSIS — I10 Essential (primary) hypertension: Secondary | ICD-10-CM | POA: Diagnosis not present

## 2023-03-30 DIAGNOSIS — R9431 Abnormal electrocardiogram [ECG] [EKG]: Secondary | ICD-10-CM

## 2023-03-30 MED ORDER — CHLORTHALIDONE 25 MG PO TABS: 12.5000 mg | ORAL_TABLET | Freq: Every day | ORAL | 0 refills | Status: DC

## 2023-03-30 NOTE — Progress Notes (Signed)
 Subjective:    Patient ID: Chase Hernandez, male    DOB: 10-05-1933, 89 y.o.   MRN: [id number]  DOS:  03/30/2023 Type of visit - description: Follow-up  Routine follow-up, feeling well. Noted to be bradycardic, he is not dizzy, feels well. No recent ambulatory BPs. Gait disorder: No recent falls, good compliance using a cane.   Review of Systems See above   Past Medical History:  Diagnosis Date   Anemia    Barrett's esophagus    Colon polyps 08/13/2012   tubular adenomas   Hyperlipidemia    Hypertension    Kidney stones 2012   h/o   LBBB (left bundle branch block)    Neck fracture (HCC) 04-2010   Dr Yetta Barre, conservative treatnment   Osteoarthritis    severe hip-back pain, saw neurology, nerve conduction study 2/11 negative; pain was due to hip arthritis    Past Surgical History:  Procedure Laterality Date   CATARACT EXTRACTION W/ INTRAOCULAR LENS  IMPLANT, BILATERAL  2013   TONSILLECTOMY     TOTAL HIP ARTHROPLASTY     right, surgery April 5,2011 @ [facility]   TOTAL HIP ARTHROPLASTY  10/2009   left    Current Outpatient Medications  Medication Instructions   atenolol-chlorthalidone (TENORETIC) 50-25 MG tablet 0.5 tablets, Oral, Daily   atorvastatin (LIPITOR) 10 mg, Oral, Daily at bedtime   GLUCOSAMINE HCL PO Daily   Multiple Vitamins-Minerals (ZINC PO) Take by mouth.   pantoprazole (PROTONIX) 20 mg, Oral, Daily before breakfast   potassium chloride (KLOR-CON) 10 MEQ tablet 10 mEq, Oral, Daily       Objective:   Physical Exam BP 116/84   Pulse (!) 48   Temp 98.2 F (36.8 C) (Oral)   Resp 16   Ht 6' (1.829 m)   Wt 173 lb (78.5 kg)   SpO2 97%   BMI 23.46 kg/m  General:   Well developed, NAD, BMI noted. HEENT:  Normocephalic . Face symmetric, atraumatic Lungs:  CTA B Normal respiratory effort, no intercostal retractions, no accessory muscle use. Heart: Bradycardic. Lower extremities: no pretibial edema bilaterally  Skin: Not pale. Not  jaundice Neurologic:  alert & oriented X3.  Speech normal, gait: Assisted by cane, slow, at baseline  psych`--  Cognition and judgment appear intact.  Cooperative with normal attention span and concentration.  Behavior appropriate. No anxious or depressed appearing.      Assessment     Assessment HTN Hyperlipidemia DJD: See surgeries Neck fracture 2012, Dr. Yetta Barre, conservative treatment H/o Kidney stones 2012 Barrett's esophagus H/o anemia: cscope , EGD done 06-2014 : colon polyps, erosive esophagitis and gastritis,+ Barrett's--- f/u prn, no further surveillance scope,  rx PPIs low-dose/long-term  PLAN: HTN, new onset A-fib: No recent ambulatory BPs, BP today is very good, he is quite bradycardic but asymptomatic. EKG today: Atrial fibrillation.  Bundle branch block not new. Case discussed with cardiology on-call for Dr. Tomie China.  Appreciate cardiology input. Plan: CMP CBC TSH Stop atenolol, we will keep him in chlorthalidone pending labs. Holter monitor Cardiology referral ER if he become symptomatic  Anticoagulation:  will be decided at the time of cardiology visit, needs careful balance of risk and benefits. Unknown to me, the patient has been taking aspirin 81 mg every day all along,  continue with it. Addendum: All of the above was discussed with the patient, he is very hesitant to see cardiology, does not like much of a workup or evaluation.  He will let me know his  final decision next week. RTC 6 weeks.  Time spent 49 min, explaining the patient ways atrial fibrillation, discussed the case with cardiology, explaining patient cardiology recommendations.

## 2023-03-30 NOTE — Patient Instructions (Addendum)
Stop atenolol chlorthalidone (Tenoretic)  Start chlorthalidone 25 mg: Half tablet daily.  We are referring you to cardiology  We are ordering a Holter monitor   Check the  blood pressure regularly Blood pressure goal:  between 110/65 and  135/85. If it is consistently higher or lower, let me know  If you have chest pain, difficulty breathing, swelling, heart pounding/palpitations: Go to the ER  Next visit with me in 6 weeks Please schedule it at the front desk

## 2023-03-30 NOTE — Telephone Encounter (Signed)
Dr. Madireddy's note: Dr. Kem Parkinson patient last seen in 2020.  Took a call as Doctor of the Day from patient's PCP Dr. Drue Novel: Regarding EKG today at the office showing atrial fibrillation with heart rate 41/min, baseline LBBB morphology which is old, patient apparently asymptomatic.  PCP is going to discontinue atenolol 50 mg that he is on currently.  I recommended if symptomatic should go to the ER otherwise should proceed with 14-day event monitor and schedule with cardiology office visit.

## 2023-03-31 LAB — CBC WITH DIFFERENTIAL/PLATELET
Absolute Lymphocytes: 1410 {cells}/uL (ref 850–3900)
Absolute Monocytes: 1090 {cells}/uL — ABNORMAL HIGH (ref 200–950)
Basophils Absolute: 56 {cells}/uL (ref 0–200)
Basophils Relative: 0.6 %
Eosinophils Absolute: 254 {cells}/uL (ref 15–500)
Eosinophils Relative: 2.7 %
HCT: 42.4 % (ref 38.5–50.0)
Hemoglobin: 15 g/dL (ref 13.2–17.1)
MCH: 33.9 pg — ABNORMAL HIGH (ref 27.0–33.0)
MCHC: 35.4 g/dL (ref 32.0–36.0)
MCV: 95.7 fL (ref 80.0–100.0)
MPV: 10.6 fL (ref 7.5–12.5)
Monocytes Relative: 11.6 %
Neutro Abs: 6589 {cells}/uL (ref 1500–7800)
Neutrophils Relative %: 70.1 %
Platelets: 276 10*3/uL (ref 140–400)
RBC: 4.43 10*6/uL (ref 4.20–5.80)
RDW: 11.9 % (ref 11.0–15.0)
Total Lymphocyte: 15 %
WBC: 9.4 10*3/uL (ref 3.8–10.8)

## 2023-03-31 LAB — COMPREHENSIVE METABOLIC PANEL
AG Ratio: 1.6 (calc) (ref 1.0–2.5)
ALT: 25 U/L (ref 9–46)
AST: 22 U/L (ref 10–35)
Albumin: 4.5 g/dL (ref 3.6–5.1)
Alkaline phosphatase (APISO): 69 U/L (ref 35–144)
BUN: 23 mg/dL (ref 7–25)
CO2: 28 mmol/L (ref 20–32)
Calcium: 10.7 mg/dL — ABNORMAL HIGH (ref 8.6–10.3)
Chloride: 97 mmol/L — ABNORMAL LOW (ref 98–110)
Creat: 1.11 mg/dL (ref 0.70–1.22)
Globulin: 2.9 g/dL (ref 1.9–3.7)
Glucose, Bld: 107 mg/dL — ABNORMAL HIGH (ref 65–99)
Potassium: 4 mmol/L (ref 3.5–5.3)
Sodium: 136 mmol/L (ref 135–146)
Total Bilirubin: 0.8 mg/dL (ref 0.2–1.2)
Total Protein: 7.4 g/dL (ref 6.1–8.1)

## 2023-03-31 LAB — TSH: TSH: 1.79 m[IU]/L (ref 0.40–4.50)

## 2023-04-01 DIAGNOSIS — I4891 Unspecified atrial fibrillation: Secondary | ICD-10-CM

## 2023-04-01 HISTORY — DX: Unspecified atrial fibrillation: I48.91

## 2023-04-01 NOTE — Assessment & Plan Note (Signed)
 HTN, new onset A-fib: No recent ambulatory BPs, BP today is very good, he is quite bradycardic but asymptomatic. EKG today: Atrial fibrillation.  Bundle branch block not new. Case discussed with cardiology on-call for Dr. Tomie China.  Appreciate cardiology input. Plan: CMP CBC TSH Stop atenolol, we will keep him in chlorthalidone pending labs. Holter monitor Cardiology referral ER if he become symptomatic  Anticoagulation:  will be decided at the time of cardiology visit, needs careful balance of risk and benefits. Unknown to me, the patient has been taking aspirin 81 mg every day all along,  continue with it. Addendum: All of the above was discussed with the patient, he is very hesitant to see cardiology, does not like much of a workup or evaluation.  He will let me know his final decision next week. RTC 6 weeks.

## 2023-04-02 ENCOUNTER — Other Ambulatory Visit: Payer: Self-pay | Admitting: Internal Medicine

## 2023-04-02 ENCOUNTER — Telehealth: Payer: Self-pay | Admitting: *Deleted

## 2023-04-02 DIAGNOSIS — I10 Essential (primary) hypertension: Secondary | ICD-10-CM

## 2023-04-02 DIAGNOSIS — I4891 Unspecified atrial fibrillation: Secondary | ICD-10-CM

## 2023-04-02 DIAGNOSIS — R9431 Abnormal electrocardiogram [ECG] [EKG]: Secondary | ICD-10-CM

## 2023-04-02 NOTE — Telephone Encounter (Signed)
 Labs essentially normal except for slightly increased calcium. Spoke with the patient, aware of above. He is taking the medication as recommended feels well.  Doing all his routine activities, he is only slightly more tired than usual. Plan: Referred to cardiology.

## 2023-04-02 NOTE — Telephone Encounter (Signed)
 Communication  Reason for CRM: Pts wife states Dr.Paz asked the pt about a referral to Cardio on Friday and advised the pt to let him know if he would consider. Pts wife says the pt has agreed and is asking for the referral order to be placed. Pts wife is also requesting a late afternoon appointment when the appointment is scheduled. She is asking to be updated via telephone they have no access to my chart

## 2023-04-03 ENCOUNTER — Telehealth: Payer: Self-pay

## 2023-04-03 NOTE — Telephone Encounter (Signed)
 Copied from CRM [id number]. Topic: General - Other >> Apr 03, 2023  4:04 PM Almira Coaster wrote: Reason for CRM: Patient's wife is calling regarding the heart monitor that Dr.Paz recommended. They feel like it's to complicated to use, they tried contacting cardiologist who told them to follow up with Dr.Paz, without any help the patient will be unable to use the heart monitor. They will continue to take his blood pressure. Best call back number [phone] best time to call is between 12pm - 7pm.

## 2023-04-04 NOTE — Telephone Encounter (Signed)
 I understand.  Will wait until you see cardiology.

## 2023-04-05 NOTE — Telephone Encounter (Signed)
 Patient's wife notified of this information.

## 2023-05-02 ENCOUNTER — Telehealth: Payer: Self-pay

## 2023-05-02 NOTE — Telephone Encounter (Signed)
 Just an [place]

## 2023-05-02 NOTE — Telephone Encounter (Signed)
 Copied from CRM [id number]. Topic: General - Other >> May 02, 2023  2:16 PM Jon Gills C wrote: Reason for CRM: Patient wife called in wanting to leave a message for Dr.Paz Stated they has been a change in schedule for patient to see the heart doctor , the changes was for 04/14 with Dr. Tomie China has been changed to 05/13 with [facility]

## 2023-05-02 NOTE — Telephone Encounter (Signed)
 Okay. Thank you.

## 2023-05-11 ENCOUNTER — Ambulatory Visit (INDEPENDENT_AMBULATORY_CARE_PROVIDER_SITE_OTHER): Payer: Medicare Other | Admitting: Internal Medicine

## 2023-05-11 ENCOUNTER — Encounter: Payer: Self-pay | Admitting: Internal Medicine

## 2023-05-11 VITALS — BP 132/60 | HR 51 | Temp 97.9°F | Resp 16 | Ht 72.0 in | Wt 167.5 lb

## 2023-05-11 DIAGNOSIS — I4819 Other persistent atrial fibrillation: Secondary | ICD-10-CM | POA: Diagnosis not present

## 2023-05-11 DIAGNOSIS — I4891 Unspecified atrial fibrillation: Secondary | ICD-10-CM

## 2023-05-11 NOTE — Patient Instructions (Signed)
 Continue the same medicines.  See cardiology as scheduled on 06/19/2023  Come back in 4 to 5 months for a checkup.

## 2023-05-11 NOTE — Progress Notes (Signed)
   Subjective:    Patient ID: Chase Hernandez, male    DOB: Nov 03, 1933, 89 y.o.   MRN: [id number]  DOS:  05/11/2023 Type of visit - description: Follow-up  Seen 03/30/2023, Dx with new onset A-fib. Since then he is doing about the same. Doing all his ADLs, goes grocery shopping without problems.  Some DOE at baseline. Denies chest pain or palpitations. Heart rate when checked at home between 50 and 60.  O2 sat 97%.  Review of Systems See above   Past Medical History:  Diagnosis Date   Anemia    Barrett's esophagus    Colon polyps 08/13/2012   tubular adenomas   Hyperlipidemia    Hypertension    Kidney stones 2012   h/o   LBBB (left bundle branch block)    Neck fracture (HCC) 04-2010   Dr Yetta Barre, conservative treatnment   Osteoarthritis    severe hip-back pain, saw neurology, nerve conduction study 2/11 negative; pain was due to hip arthritis    Past Surgical History:  Procedure Laterality Date   CATARACT EXTRACTION W/ INTRAOCULAR LENS  IMPLANT, BILATERAL  2013   TONSILLECTOMY     TOTAL HIP ARTHROPLASTY     right, surgery April 5,2011 @ [facility]   TOTAL HIP ARTHROPLASTY  10/2009   left    Current Outpatient Medications  Medication Instructions   aspirin EC 81 mg, Daily   atorvastatin (LIPITOR) 10 mg, Oral, Daily at bedtime   chlorthalidone (HYGROTON) 12.5 mg, Oral, Daily   GLUCOSAMINE HCL PO Daily   Multiple Vitamins-Minerals (ZINC PO) Take by mouth.   pantoprazole (PROTONIX) 20 mg, Oral, Daily before breakfast   potassium chloride (KLOR-CON) 10 MEQ tablet 10 mEq, Oral, Daily       Objective:   Physical Exam BP 132/60   Pulse (!) 51   Temp 97.9 F (36.6 C) (Oral)   Resp 16   Ht 6' (1.829 m)   Wt 167 lb 8 oz (76 kg)   SpO2 96%   BMI 22.72 kg/m  General:   Well developed, NAD, BMI noted. HEENT:  Normocephalic . Face symmetric, atraumatic Lungs:  CTA B Normal respiratory effort, no intercostal retractions, no accessory muscle use. Heart:  Bradycardic Lower extremities: no pretibial edema bilaterally  Skin: Not pale. Not jaundice Neurologic:  alert & oriented X3.  Speech normal, gait assisted by cane.   Psych--  Cognition and judgment appear intact.  Cooperative with normal attention span and concentration.  Behavior appropriate. No anxious or depressed appearing.      Assessment     Assessment HTN Hyperlipidemia DJD: See surgeries Neck fracture 2012, Dr. Yetta Barre, conservative treatment H/o Kidney stones 2012 Barrett's esophagus H/o anemia: cscope , EGD done 06-2014 : colon polyps, erosive esophagitis and gastritis,+ Barrett's--- f/u prn, no further surveillance scope,  rx PPIs low-dose/long-term  PLAN: Atrial fibrillation: First noted 03/30/2023.  He is bradycardic, not on beta-blockers.  On aspirin. Feeling well, at baseline. Decided not to proceed with a Holter monitor: "Too much stress".  "Cannot use the patch for 7 days". EKG today: A Fib, Bradycardic, BBB not new. Plan: Continue present care.  See cardiology as scheduled.

## 2023-05-13 NOTE — Assessment & Plan Note (Signed)
 Atrial fibrillation: First noted 03/30/2023.  He is bradycardic, not on beta-blockers.  On aspirin. Feeling well, at baseline. Decided not to proceed with a Holter monitor: "Too much stress".  "Cannot use the patch for 7 days". EKG today: A Fib, Bradycardic, BBB not new. Plan: Continue present care.  See cardiology as scheduled.

## 2023-05-22 ENCOUNTER — Other Ambulatory Visit: Payer: Self-pay | Admitting: Internal Medicine

## 2023-05-22 ENCOUNTER — Ambulatory Visit: Payer: Medicare Other | Admitting: Cardiology

## 2023-06-19 ENCOUNTER — Ambulatory Visit: Attending: Cardiology | Admitting: Cardiology

## 2023-06-19 VITALS — BP 128/70 | HR 62 | Ht 72.0 in | Wt 167.0 lb

## 2023-06-19 DIAGNOSIS — I4819 Other persistent atrial fibrillation: Secondary | ICD-10-CM | POA: Diagnosis not present

## 2023-06-19 DIAGNOSIS — I447 Left bundle-branch block, unspecified: Secondary | ICD-10-CM | POA: Diagnosis not present

## 2023-06-19 DIAGNOSIS — I10 Essential (primary) hypertension: Secondary | ICD-10-CM | POA: Diagnosis not present

## 2023-06-19 DIAGNOSIS — Z860101 Personal history of adenomatous and serrated colon polyps: Secondary | ICD-10-CM | POA: Diagnosis not present

## 2023-06-19 NOTE — Progress Notes (Signed)
 Cardiology Consultation:    Date:  06/19/2023   ID:  Chase Hernandez, Chase Hernandez Apr 18, 1933, MRN [id number]  PCP:  Ezell Hollow, MD  Cardiologist:  Ralene Burger, MD   Referring MD: Ezell Hollow, MD   Chief Complaint  Patient presents with   Follow-up    History of Present Illness:    CHANDARA RORRER is a 89 y.o. male who is being seen today for the evaluation of atrial fibrillation at the request of Ezell Hollow, MD. past medical history significant for colon polyps, Barrett's esophagus, left bundle branch block.  In 2020 he was evaluated by our office for left bundle branch block.  That evaluation was unrevealing.  He was referred to us  because he went to his primary care physician for routine follow-up he was fine to have atrial fibrillation with controlled ventricular rate he was asked to have a be evaluated by aspirate.  He is doing well he is 89 years old he walk around with a cane described to have 1 fall a couple years ago when he tripped on something but otherwise seems to be doing relatively stable.  Denies have any chest pain tightness squeezing pressure burning chest no palpitations completely unaware of atrial fibrillation.  He is rate appears to be controlled.  I did review his EKG in the chart EKG from February show atrial fibrillation EKG from 2020 showed normal rhythm.  There was some first-degree AV block previously.  Does not smoke never did used to be very active but now obviously with his age and overall orthopedic limitations he does do somewhat limited exercises  Past Medical History:  Diagnosis Date   Anemia    Barrett's esophagus    Colon polyps 08/13/2012   tubular adenomas   Hyperlipidemia    Hypertension    Kidney stones 2012   h/o   LBBB (left bundle branch block)    Neck fracture (HCC) 04-2010   Dr Rochelle Chu, conservative treatnment   Osteoarthritis    severe hip-back pain, saw neurology, nerve conduction study 2/11 negative; pain was due to hip arthritis     Past Surgical History:  Procedure Laterality Date   CATARACT EXTRACTION W/ INTRAOCULAR LENS  IMPLANT, BILATERAL  2013   TONSILLECTOMY     TOTAL HIP ARTHROPLASTY     right, surgery April 5,2011 @ [facility]   TOTAL HIP ARTHROPLASTY  10/2009   left    Current Medications: Current Meds  Medication Sig   aspirin EC 81 MG tablet Take 81 mg by mouth daily. Swallow whole.   atenolol -chlorthalidone  (TENORETIC ) 50-25 MG tablet Take 0.5 tablets by mouth daily.   atorvastatin  (LIPITOR) 10 MG tablet Take 1 tablet (10 mg total) by mouth at bedtime.   GLUCOSAMINE HCL PO Take 1 tablet by mouth daily.   Multiple Vitamins-Minerals (ZINC PO) Take 1 tablet by mouth daily.   pantoprazole  (PROTONIX ) 20 MG tablet Take 1 tablet (20 mg total) by mouth daily before breakfast.   potassium chloride  (KLOR-CON ) 10 MEQ tablet Take 1 tablet (10 mEq total) by mouth daily.   [DISCONTINUED] chlorthalidone  (HYGROTON ) 25 MG tablet Take 0.5 tablets (12.5 mg total) by mouth daily.     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[employer]), retired Loss adjuster, chartered: RETIRED  Tobacco Use   Smoking status: Never   Smokeless tobacco: Never  Substance and Sexual Activity   Alcohol use: Yes    Alcohol/week: 0.0 standard drinks of alcohol    Comment: 6 oz red wine per night   Drug use: No   Sexual activity: Never  Other Topics Concern   Not on file  Social History Narrative   original from [place] , 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[employer]), retired Insurance underwriter    Married, 1 son who lives in [place] belt, martial arts    Social Drivers of Health   Financial Resource Strain: Low Risk  (12/19/2022)   Overall Financial Resource Strain  (CARDIA)    Difficulty of Paying Living Expenses: Not hard at all  Food Insecurity: No Food Insecurity (12/19/2022)   Hunger Vital Sign    Worried About Running Out of Food in the Last Year: Never true    Ran Out of Food in the Last Year: Never true  Transportation Needs: No Transportation Needs (12/19/2022)   PRAPARE - Administrator, Civil Service (Medical): No    Lack of Transportation (Non-Medical): No  Physical Activity: Insufficiently Active (12/19/2022)   Exercise Vital Sign    Days of Exercise per Week: 3 days    Minutes of Exercise per Session: 40 min  Stress: No Stress Concern Present (12/19/2022)   [employer] of Occupational Health - Occupational Stress Questionnaire    Feeling of Stress : Not at all  Social Connections: Socially Isolated (12/19/2022)   Social Connection and Isolation Panel [NHANES]    Frequency of Communication with Friends and Family: Once a week    Frequency of Social Gatherings with Friends and Family: Once a week    Attends Religious Services: Never    Database administrator or Organizations: No    Attends Engineer, structural: Never    Marital Status: Married     Family History: The patient's family history includes Breast cancer in his mother; Diabetes in an other family member; Prostate cancer (age of onset: 80) in his father. There is no history of Heart attack or Colon cancer. ROS:   Please see the history of present illness.    All 14 point review of systems negative except as described per history of present illness.  EKGs/Labs/Other Studies Reviewed:    The following studies were reviewed today:   EKG:       Recent Labs: 03/30/2023: ALT 25; BUN 23; Creat 1.11; Hemoglobin 15.0; Platelets 276; Potassium 4.0; Sodium 136; TSH 1.79  Recent Lipid Panel    Component Value Date/Time   CHOL 128 07/28/2022 1515   TRIG 83 07/28/2022 1515   TRIG 76 12/21/2005 0937   HDL 61 07/28/2022 1515   CHOLHDL 2.1 07/28/2022  1515   VLDL 15.2 10/24/2021 1430   LDLCALC 50 07/28/2022 1515   LDLDIRECT 142.8 02/17/2010 1021    Physical Exam:    VS:  BP 128/70 (BP Location: Right Arm, Patient Position: Sitting)   Pulse 62   Ht 6' (1.829 m)   Wt 167 lb (75.8 kg)   SpO2 96%   BMI 22.65 kg/m     Wt Readings from Last 3 Encounters:  06/19/23 167 lb (75.8 kg)  05/11/23 167 lb 8 oz (76 kg)  03/30/23 173 lb (78.5 kg)     GEN:  Well nourished, well  developed in no acute distress HEENT: Normal NECK: No JVD; No carotid bruits LYMPHATICS: No lymphadenopathy CARDIAC: RRR, no murmurs, no rubs, no gallops RESPIRATORY:  Clear to auscultation without rales, wheezing or rhonchi  ABDOMEN: Soft, non-tender, non-distended MUSCULOSKELETAL:  No edema; No deformity  SKIN: Warm and dry NEUROLOGIC:  Alert and oriented x 3 PSYCHIATRIC:  Normal affect   ASSESSMENT:    1. Essential hypertension   2. Persistent atrial fibrillation (HCC)   3. Left bundle branch block (LBBB)   4. Hx of adenomatous colonic polyps    PLAN:    In order of problems listed above:  Atrial fibrillation which is new discovery.  His CHADS2 Vascor equals 3.  We had a long discussion what to do about the situation he told me right away he does not want to have any surgery but he will open up to anticoagulation which I explained to him rational for weight.  What make me concern is the fact that he does have history of iron  deficiency anemia as well as some colon polyps of the colon and Barrett's esophagus so I need to check his stool for guaiac, will check CBC.  If everything is fine we will initiate anticoagulation.  He is dose will be 5 mg twice daily.  I will bring him back to my office in about 6 weeks to see how he does.  However I have a feeling that he would not want to be cardioverted.  Echocardiogram also will be done to assess left ventricular ejection fraction and assess atrial size. Left bundle branch which is chronic problem no evidence of  congestive heart failure previously echocardiogram was assessed with normal ejection fraction. Essential hypertension blood pressure well-controlled   Medication Adjustments/Labs and Tests Ordered: Current medicines are reviewed at length with the patient today.  Concerns regarding medicines are outlined above.  Orders Placed This Encounter  Procedures   EKG 12-Lead   No orders of the defined types were placed in this encounter.   Signed, Manfred Seed, MD, [facility]. 06/19/2023 4:23 PM    [facility]

## 2023-06-19 NOTE — Addendum Note (Signed)
 Addended by: Aurelio Leer I on: 06/19/2023 04:57 PM   Modules accepted: Orders

## 2023-06-19 NOTE — Patient Instructions (Addendum)
 Medication Instructions:  Your physician recommends that you continue on your current medications as directed. Please refer to the Current Medication list given to you today.  *If you need a refill on your cardiac medications before your next appointment, please call your pharmacy*  Lab Work: Your physician recommends that you return for lab work in:   Labs today: CBC, Stool for guaic  If you have labs (blood work) drawn today and your tests are completely normal, you will receive your results only by: MyChart Message (if you have MyChart) OR A paper copy in the mail If you have any lab test that is abnormal or we need to change your treatment, we will call you to review the results.  Testing/Procedures: Your physician has requested that you have an echocardiogram. Echocardiography is a painless test that uses sound waves to create images of your heart. It provides your doctor with information about the size and shape of your heart and how well your heart's chambers and valves are working. This procedure takes approximately one hour. There are no restrictions for this procedure. Please do NOT wear cologne, perfume, aftershave, or lotions (deodorant is allowed). Please arrive 15 minutes prior to your appointment time.  Please note: We ask at that you not bring children with you during ultrasound (echo/ vascular) testing. Due to room size and safety concerns, children are not allowed in the ultrasound rooms during exams. Our front office staff cannot provide observation of children in our lobby area while testing is being conducted. An adult accompanying a patient to their appointment will only be allowed in the ultrasound room at the discretion of the ultrasound technician under special circumstances. We apologize for any inconvenience.   A zio monitor was ordered today. It will remain on for 14 days. You will then return monitor and event diary in provided box. It takes 1-2 weeks for report to  be downloaded and returned to us . We will call you with the results. If monitor falls off or has orange flashing light, please call Zio for further instructions.    Follow-Up: At [facility], you and your health needs are our priority.  As part of our continuing mission to provide you with exceptional heart care, our providers are all part of one team.  This team includes your primary Cardiologist (physician) and Advanced Practice Providers or APPs (Physician Assistants and Nurse Practitioners) who all work together to provide you with the care you need, when you need it.  Your next appointment:   6 week(s)  Provider:   Ralene Burger, MD    We recommend signing up for the patient portal called "MyChart".  Sign up information is provided on this After Visit Summary.  MyChart is used to connect with patients for Virtual Visits (Telemedicine).  Patients are able to view lab/test results, encounter notes, upcoming appointments, etc.  Non-urgent messages can be sent to your provider as well.   To learn more about what you can do with MyChart, go to [email or web address].   Other Instructions None

## 2023-06-20 ENCOUNTER — Ambulatory Visit: Attending: Cardiology

## 2023-06-20 DIAGNOSIS — I447 Left bundle-branch block, unspecified: Secondary | ICD-10-CM

## 2023-06-20 DIAGNOSIS — I10 Essential (primary) hypertension: Secondary | ICD-10-CM

## 2023-06-20 DIAGNOSIS — I4819 Other persistent atrial fibrillation: Secondary | ICD-10-CM

## 2023-06-20 DIAGNOSIS — Z860101 Personal history of adenomatous and serrated colon polyps: Secondary | ICD-10-CM

## 2023-06-25 DIAGNOSIS — Z860101 Personal history of adenomatous and serrated colon polyps: Secondary | ICD-10-CM | POA: Diagnosis not present

## 2023-06-25 DIAGNOSIS — I447 Left bundle-branch block, unspecified: Secondary | ICD-10-CM | POA: Diagnosis not present

## 2023-06-25 DIAGNOSIS — I4819 Other persistent atrial fibrillation: Secondary | ICD-10-CM | POA: Diagnosis not present

## 2023-06-25 DIAGNOSIS — I10 Essential (primary) hypertension: Secondary | ICD-10-CM | POA: Diagnosis not present

## 2023-06-26 LAB — CBC
Hematocrit: 46.4 % (ref 37.5–51.0)
Hemoglobin: 16.2 g/dL (ref 13.0–17.7)
MCH: 34.2 pg — ABNORMAL HIGH (ref 26.6–33.0)
MCHC: 34.9 g/dL (ref 31.5–35.7)
MCV: 98 fL — ABNORMAL HIGH (ref 79–97)
Platelets: 377 10*3/uL (ref 150–450)
RBC: 4.73 x10E6/uL (ref 4.14–5.80)
RDW: 12.1 % (ref 11.6–15.4)
WBC: 7.8 10*3/uL (ref 3.4–10.8)

## 2023-06-29 ENCOUNTER — Ambulatory Visit: Payer: Self-pay | Admitting: Cardiology

## 2023-06-29 DIAGNOSIS — I4819 Other persistent atrial fibrillation: Secondary | ICD-10-CM | POA: Diagnosis not present

## 2023-06-29 DIAGNOSIS — I10 Essential (primary) hypertension: Secondary | ICD-10-CM | POA: Diagnosis not present

## 2023-06-29 DIAGNOSIS — Z860101 Personal history of adenomatous and serrated colon polyps: Secondary | ICD-10-CM | POA: Diagnosis not present

## 2023-06-29 DIAGNOSIS — I447 Left bundle-branch block, unspecified: Secondary | ICD-10-CM | POA: Diagnosis not present

## 2023-06-30 LAB — FECAL OCCULT BLOOD, IMMUNOCHEMICAL: Fecal Occult Bld: NEGATIVE

## 2023-06-30 LAB — SPECIMEN STATUS REPORT

## 2023-07-10 ENCOUNTER — Telehealth: Payer: Self-pay

## 2023-07-10 NOTE — Telephone Encounter (Signed)
 Left message on My Chart with normal results per Dr. Vanetta Shawl note. Routed to PCP.

## 2023-07-10 NOTE — Telephone Encounter (Signed)
 Pt viewed lab results on My Chart per Dr. Vanetta Shawl note. Routed to PCP.

## 2023-07-13 DIAGNOSIS — I447 Left bundle-branch block, unspecified: Secondary | ICD-10-CM | POA: Diagnosis not present

## 2023-07-13 DIAGNOSIS — I10 Essential (primary) hypertension: Secondary | ICD-10-CM | POA: Diagnosis not present

## 2023-07-23 DIAGNOSIS — I10 Essential (primary) hypertension: Secondary | ICD-10-CM

## 2023-07-23 DIAGNOSIS — I4819 Other persistent atrial fibrillation: Secondary | ICD-10-CM | POA: Diagnosis not present

## 2023-07-23 DIAGNOSIS — Z860101 Personal history of adenomatous and serrated colon polyps: Secondary | ICD-10-CM | POA: Diagnosis not present

## 2023-07-23 DIAGNOSIS — I447 Left bundle-branch block, unspecified: Secondary | ICD-10-CM | POA: Diagnosis not present

## 2023-07-30 ENCOUNTER — Other Ambulatory Visit: Payer: Self-pay | Admitting: Cardiology

## 2023-07-30 DIAGNOSIS — I4819 Other persistent atrial fibrillation: Secondary | ICD-10-CM

## 2023-07-30 DIAGNOSIS — Z860101 Personal history of adenomatous and serrated colon polyps: Secondary | ICD-10-CM

## 2023-07-30 DIAGNOSIS — I447 Left bundle-branch block, unspecified: Secondary | ICD-10-CM

## 2023-07-30 DIAGNOSIS — I10 Essential (primary) hypertension: Secondary | ICD-10-CM

## 2023-08-02 ENCOUNTER — Ambulatory Visit (HOSPITAL_BASED_OUTPATIENT_CLINIC_OR_DEPARTMENT_OTHER)
Admission: RE | Admit: 2023-08-02 | Discharge: 2023-08-02 | Disposition: A | Discharge status: Home / Self Care | Source: Ambulatory Visit | Attending: Cardiology | Admitting: Cardiology

## 2023-08-02 DIAGNOSIS — I4819 Other persistent atrial fibrillation: Secondary | ICD-10-CM | POA: Diagnosis not present

## 2023-08-02 DIAGNOSIS — I447 Left bundle-branch block, unspecified: Secondary | ICD-10-CM | POA: Diagnosis not present

## 2023-08-04 LAB — ECHOCARDIOGRAM COMPLETE
AR max vel: 1.56 cm2
AV Area VTI: 1.66 cm2
AV Area mean vel: 1.48 cm2
AV Mean grad: 1.7 mmHg
AV Peak grad: 3.1 mmHg
Ao pk vel: 0.87 m/s
Area-P 1/2: 5.22 cm2
Calc EF: 38.1 %
S' Lateral: 2.8 cm
Single Plane A2C EF: 34.4 %
Single Plane A4C EF: 35.9 %

## 2023-08-05 ENCOUNTER — Other Ambulatory Visit: Payer: Self-pay | Admitting: Internal Medicine

## 2023-08-06 ENCOUNTER — Telehealth: Payer: Self-pay

## 2023-08-06 NOTE — Telephone Encounter (Signed)
Left message on My Chart with monitor results per Dr. Vanetta Shawl note. Routed to PCP.

## 2023-08-07 ENCOUNTER — Telehealth: Payer: Self-pay

## 2023-08-07 ENCOUNTER — Ambulatory Visit: Payer: Self-pay | Admitting: Cardiology

## 2023-08-07 NOTE — Telephone Encounter (Signed)
 Pt viewed Monitor results on My Chart per Dr. Vanetta Shawl note. Routed to PCP.

## 2023-08-08 ENCOUNTER — Telehealth: Payer: Self-pay

## 2023-08-08 NOTE — Telephone Encounter (Signed)
 Left message on My Chart with Echo results per Dr. Vanetta Shawl note. Routed to PCP.

## 2023-08-09 ENCOUNTER — Telehealth: Payer: Self-pay

## 2023-08-09 NOTE — Telephone Encounter (Signed)
 Pt viewed Echo results on My Chart per Dr. Vanetta Shawl note. Routed to PCP.

## 2023-08-15 DIAGNOSIS — E785 Hyperlipidemia, unspecified: Secondary | ICD-10-CM | POA: Insufficient documentation

## 2023-08-15 DIAGNOSIS — I10 Essential (primary) hypertension: Secondary | ICD-10-CM | POA: Insufficient documentation

## 2023-08-15 DIAGNOSIS — D649 Anemia, unspecified: Secondary | ICD-10-CM | POA: Insufficient documentation

## 2023-08-16 ENCOUNTER — Encounter: Payer: Self-pay | Admitting: Cardiology

## 2023-08-16 ENCOUNTER — Ambulatory Visit: Attending: Cardiology | Admitting: Cardiology

## 2023-08-16 VITALS — BP 124/70 | HR 111 | Ht 72.0 in | Wt 163.0 lb

## 2023-08-16 DIAGNOSIS — I10 Essential (primary) hypertension: Secondary | ICD-10-CM | POA: Insufficient documentation

## 2023-08-16 DIAGNOSIS — I447 Left bundle-branch block, unspecified: Secondary | ICD-10-CM | POA: Insufficient documentation

## 2023-08-16 DIAGNOSIS — E785 Hyperlipidemia, unspecified: Secondary | ICD-10-CM | POA: Insufficient documentation

## 2023-08-16 DIAGNOSIS — I4819 Other persistent atrial fibrillation: Secondary | ICD-10-CM | POA: Diagnosis not present

## 2023-08-16 MED ORDER — APIXABAN 5 MG PO TABS: 5.0000 mg | ORAL_TABLET | Freq: Two times a day (BID) | ORAL | 3 refills | Status: DC

## 2023-08-16 MED ORDER — METOPROLOL SUCCINATE ER 25 MG PO TB24: 25.0000 mg | ORAL_TABLET | Freq: Every day | ORAL | 3 refills | Status: AC

## 2023-08-16 NOTE — Progress Notes (Signed)
 Cardiology Office Note:    Date:  08/16/2023   ID:  Chase Hernandez [birth year], MRN [id number]  PCP:  Chase Aloysius BRAVO, MD  Cardiologist:  Chase Fitch, MD    Referring MD: Chase Aloysius BRAVO, MD   No chief complaint on file.   History of Present Illness:    Chase Hernandez is a 89 y.o. male past medical history significant for Barrett's esophagus, left bundle branch block that being investigated in 2020, he was referred to me because incidentally he was discovered to have atrial fibrillation completely asymptomatic and overall doing well.  Echocardiogram has been done which showed ejection fraction 45%, Atrius appears to be in good shape, mild mitral valve regurgitation mild tricuspid valve regurgitation, moving to show persistent atrial fibrillation with rate of 96.  Overall he is doing fine  Past Medical History:  Diagnosis Date   Anemia    Annual physical exam 02/20/2011   Atrial fibrillation (HCC) 04/01/2023   Barrett esophagus 06/25/2016   BPH (benign prostatic hyperplasia) 07/30/2008   Qualifier: Diagnosis of   By: Chase Hernandez CMA, Chemira         Colon polyps 08/13/2012   tubular adenomas   Degenerative arthritis of hip 06/13/2011   Overview:   This patient had a  right total hip arthroplasty on May 12, 2009 and left total hip arthroplasty on October 19, 2009.     He is here today for his one year follow-up of his left hip, with repeat radiographs for routine monitoring     Dyslipidemia 11/13/2006   Qualifier: Diagnosis of   By: Amon MD, Aloysius Hernandez.        Essential hypertension 11/13/2006   Qualifier: Diagnosis of   By: Amon MD, Aloysius Hernandez.        Hx of adenomatous colonic polyps 06/18/2014   Hyperlipidemia    Hypertension    Iron  (Fe) deficiency anemia 06/18/2014   Kidney stones 02/06/2010   h/o   Left bundle branch block (LBBB) 10/29/2018   Neck fracture (HCC) 04/07/2010   Dr Chase Hernandez, conservative treatnment   Osteoarthritis    severe hip-back pain, saw neurology, nerve  conduction study 2/11 negative; pain was due to hip arthritis    Past Surgical History:  Procedure Laterality Date   CATARACT EXTRACTION W/ INTRAOCULAR LENS  IMPLANT, BILATERAL  2013   TONSILLECTOMY     TOTAL HIP ARTHROPLASTY     right, surgery April 5,2011 @ [facility]   TOTAL HIP ARTHROPLASTY  10/2009   left    Current Medications: Current Meds  Medication Sig   aspirin EC 81 MG tablet Take 81 mg by mouth daily. Swallow whole.   atorvastatin  (LIPITOR) 10 MG tablet Take 1 tablet (10 mg total) by mouth at bedtime.   chlorthalidone  (HYGROTON ) 25 MG tablet Take 12.5 mg by mouth daily.   GLUCOSAMINE HCL PO Take 1 tablet by mouth daily.   Multiple Vitamins-Minerals (ZINC PO) Take 1 tablet by mouth daily.   pantoprazole  (PROTONIX ) 20 MG tablet Take 1 tablet (20 mg total) by mouth daily before breakfast.   potassium chloride  (KLOR-CON ) 10 MEQ tablet Take 1 tablet (10 mEq total) by mouth daily.     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[employer]), retired Loss adjuster, chartered: RETIRED  Tobacco Use   Smoking status: Never   Smokeless tobacco: Never  Substance and Sexual Activity   Alcohol use: Yes    Alcohol/week: 0.0 standard drinks of alcohol    Comment: 6 oz red wine per night   Drug use: No   Sexual activity: Never  Other Topics Concern   Not on file  Social History Narrative   original from [place] , 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[employer]), retired Insurance underwriter    Married, 1 son who lives in [place] belt, martial arts    Social Drivers of Health   Financial Resource Strain: Low Risk  (12/19/2022)   Overall Financial Resource Strain (CARDIA)    Difficulty of Paying Living Expenses: Not hard at all   Food Insecurity: No Food Insecurity (12/19/2022)   Hunger Vital Sign    Worried About Running Out of Food in the Last Year: Never true    Ran Out of Food in the Last Year: Never true  Transportation Needs: No Transportation Needs (12/19/2022)   PRAPARE - Administrator, Civil Service (Medical): No    Lack of Transportation (Non-Medical): No  Physical Activity: Insufficiently Active (12/19/2022)   Exercise Vital Sign    Days of Exercise per Week: 3 days    Minutes of Exercise per Session: 40 min  Stress: No Stress Concern Present (12/19/2022)   [employer] of Occupational Health - Occupational Stress Questionnaire    Feeling of Stress : Not at all  Social Connections: Socially Isolated (12/19/2022)   Social Connection and Isolation Panel    Frequency of Communication with Friends and Family: Once a week    Frequency of Social Gatherings with Friends and Family: Once a week    Attends Religious Services: Never    Database administrator or Organizations: No    Attends Engineer, structural: Never    Marital Status: Married     Family History: The patient's family history includes Breast cancer in his mother; Diabetes in an other family member; Prostate cancer (age of onset: 79) in his father. There is no history of Heart attack or Colon cancer. ROS:   Please see the history of present illness.    All 14 point review of systems negative except as described per history of present illness  EKGs/Labs/Other Studies Reviewed:    EKG Interpretation Date/Time:  Thursday August 16 2023 15:43:14 EDT Ventricular Rate:  111 PR Interval:    QRS Duration:  126 QT Interval:  322 QTC Calculation: 437 R Axis:   151  Text Interpretation: Atrial fibrillation with rapid ventricular response Non-specific intra-ventricular conduction block Cannot rule out Anterior infarct (cited on or before 19-Jun-2023) When compared with ECG of 19-Jun-2023 15:46, Questionable change in QRS  axis Non-specific change in ST segment in Inferior leads T wave inversion now evident in Inferior leads Confirmed by Chase Hernandez [id number]) on 08/16/2023 3:49:34 PM    Recent Labs: 03/30/2023: ALT 25; BUN 23; Creat 1.11; Potassium 4.0; Sodium 136; TSH 1.79 06/25/2023: Hemoglobin 16.2; Platelets 377  Recent Lipid Panel    Component Value Date/Time   CHOL 128 07/28/2022 1515   TRIG 83 07/28/2022 1515   TRIG 76 12/21/2005 0937   HDL 61 07/28/2022 1515   CHOLHDL 2.1 07/28/2022 1515   VLDL 15.2 10/24/2021 1430   LDLCALC 50 07/28/2022 1515   LDLDIRECT 142.8 02/17/2010 1021  Physical Exam:    VS:  BP 124/70   Pulse (!) 111   Ht 6' (1.829 m)   Wt 163 lb (73.9 kg)   SpO2 96%   BMI 22.11 kg/m     Wt Readings from Last 3 Encounters:  08/16/23 163 lb (73.9 kg)  06/19/23 167 lb (75.8 kg)  05/11/23 167 lb 8 oz (76 kg)     GEN:  Well nourished, well developed in no acute distress HEENT: Normal NECK: No JVD; No carotid bruits LYMPHATICS: No lymphadenopathy CARDIAC: Irregularly irregular no murmurs, no rubs, no gallops RESPIRATORY:  Clear to auscultation without rales, wheezing or rhonchi  ABDOMEN: Soft, non-tender, non-distended MUSCULOSKELETAL:  No edema; No deformity  SKIN: Warm and dry LOWER EXTREMITIES: no swelling NEUROLOGIC:  Alert and oriented x 3 PSYCHIATRIC:  Normal affect   ASSESSMENT:    1. Essential hypertension   2. Persistent atrial fibrillation (HCC)   3. Left bundle branch block (LBBB)   4. Dyslipidemia    PLAN:    In order of problems listed above:  Persistent atrial fibrillation we had a long discussion about potential cardioversion he is very reluctant he is afraid he is gone at this therapy his rhythm it look like he will most likely stick with rate control strategy.  I will initiate anticoagulation, dose will be 5 mg twice daily, he is more than 80 but his weight is 73 kg and his last creatinine was 1.1.  I will discontinue his aspirin, will recheck  CBC in about 2 weeks. Left bundle branch block with diminished ejection fraction.  He is reluctant to initiate any additional medications, I will give him prescription for metoprolol  succinate 25 daily to start taking for 2 reasons to control his ventricular rate better as well as for cardiomyopathy however this degree of diminished ejection fraction is probably simply related to left bundle branch block. Dyslipidemia he is on Lipitor 10 which I will continue last LDL 50 HDL of 61 this is from 07/28/2022 continue present management   Medication Adjustments/Labs and Tests Ordered: Current medicines are reviewed at length with the patient today.  Concerns regarding medicines are outlined above.  Orders Placed This Encounter  Procedures   EKG 12-Lead   Medication changes: No orders of the defined types were placed in this encounter.   Signed, Chase DOROTHA Fitch, MD, [facility] 08/16/2023 4:03 PM    [facility]

## 2023-08-16 NOTE — Addendum Note (Signed)
 Addended by: ARLOA PLANAS D on: 08/16/2023 04:20 PM   Modules accepted: Orders

## 2023-08-16 NOTE — Patient Instructions (Addendum)
 Medication Instructions:   START: Eliquis  5mg  1 tablet twice daily  STOP: Aspirin  START: Metoprolol  Succinate 25mg  1 tablet daily - 1 week after starting Eliquis    Lab Work: 3rd Floor   [address]  Your physician recommends that you return for lab work in:  10 days You can come Monday through Friday 8:00 am to 11:30AM and 1:00 to 4:00. You do not need to make an appointment as the order has already been placed. The labs you are going to have done are  CBC    Testing/Procedures: None Ordered   Follow-Up: At [facility], you and your health needs are our priority.  As part of our continuing mission to provide you with exceptional heart care, we have created designated Provider Care Teams.  These Care Teams include your primary Cardiologist (physician) and Advanced Practice Providers (APPs -  Physician Assistants and Nurse Practitioners) who all work together to provide you with the care you need, when you need it.  We recommend signing up for the patient portal called MyChart.  Sign up information is provided on this After Visit Summary.  MyChart is used to connect with patients for Virtual Visits (Telemedicine).  Patients are able to view lab/test results, encounter notes, upcoming appointments, etc.  Non-urgent messages can be sent to your provider as well.   To learn more about what you can do with MyChart, go to [email or web address].    Your next appointment:   3 month(s)  The format for your next appointment:   In Person  Provider:   Lamar Fitch, MD    Other Instructions NA

## 2023-08-27 DIAGNOSIS — I4819 Other persistent atrial fibrillation: Secondary | ICD-10-CM | POA: Diagnosis not present

## 2023-08-28 LAB — CBC
Hematocrit: 46.1 % (ref 37.5–51.0)
Hemoglobin: 15.9 g/dL (ref 13.0–17.7)
MCH: 34.4 pg — ABNORMAL HIGH (ref 26.6–33.0)
MCHC: 34.5 g/dL (ref 31.5–35.7)
MCV: 100 fL — ABNORMAL HIGH (ref 79–97)
Platelets: 352 x10E3/uL (ref 150–450)
RBC: 4.62 x10E6/uL (ref 4.14–5.80)
RDW: 12.8 % (ref 11.6–15.4)
WBC: 9.6 x10E3/uL (ref 3.4–10.8)

## 2023-08-30 ENCOUNTER — Ambulatory Visit: Payer: Self-pay | Admitting: Cardiology

## 2023-08-30 ENCOUNTER — Other Ambulatory Visit: Payer: Self-pay | Admitting: Internal Medicine

## 2023-10-15 ENCOUNTER — Ambulatory Visit: Admitting: Internal Medicine

## 2023-10-29 ENCOUNTER — Encounter: Payer: Self-pay | Admitting: Internal Medicine

## 2023-10-29 ENCOUNTER — Ambulatory Visit (INDEPENDENT_AMBULATORY_CARE_PROVIDER_SITE_OTHER): Admitting: Internal Medicine

## 2023-10-29 VITALS — BP 134/80 | HR 108 | Temp 97.8°F | Resp 16 | Ht 72.0 in | Wt 164.4 lb

## 2023-10-29 DIAGNOSIS — I4819 Other persistent atrial fibrillation: Secondary | ICD-10-CM

## 2023-10-29 DIAGNOSIS — E785 Hyperlipidemia, unspecified: Secondary | ICD-10-CM | POA: Diagnosis not present

## 2023-10-29 DIAGNOSIS — I10 Essential (primary) hypertension: Secondary | ICD-10-CM | POA: Diagnosis not present

## 2023-10-29 NOTE — Progress Notes (Unsigned)
 Subjective:    Patient ID: Chase Hernandez, male    DOB: 01/03/1934, 89 y.o.   MRN: [id number]  DOS:  10/29/2023 Type of visit - description: Routine checkup  Routine checkup. Saw cardiology, note reviewed. Good compliance with medications. Denies chest pain or difficulty breathing. No palpitations or lower extremity edema. Starting anticoagulation, denies nausea or vomiting.  No blood in the stools or in the urine.   Review of Systems See above   Past Medical History:  Diagnosis Date   Anemia    Annual physical exam 02/20/2011   Atrial fibrillation (HCC) 04/01/2023   Barrett esophagus 06/25/2016   BPH (benign prostatic hyperplasia) 07/30/2008   Qualifier: Diagnosis of   By: Joshua CMA, Chemira         Colon polyps 08/13/2012   tubular adenomas   Degenerative arthritis of hip 06/13/2011   Overview:   This patient had a  right total hip arthroplasty on May 12, 2009 and left total hip arthroplasty on October 19, 2009.     He is here today for his one year follow-up of his left hip, with repeat radiographs for routine monitoring     Dyslipidemia 11/13/2006   Qualifier: Diagnosis of   By: Amon MD, Aloysius BRAVO.        Essential hypertension 11/13/2006   Qualifier: Diagnosis of   By: Amon MD, Aloysius BRAVO.        Hx of adenomatous colonic polyps 06/18/2014   Hyperlipidemia    Hypertension    Iron  (Fe) deficiency anemia 06/18/2014   Kidney stones 02/06/2010   h/o   Left bundle branch block (LBBB) 10/29/2018   Neck fracture (HCC) 04/07/2010   Dr Joshua, conservative treatnment   Osteoarthritis    severe hip-back pain, saw neurology, nerve conduction study 2/11 negative; pain was due to hip arthritis    Past Surgical History:  Procedure Laterality Date   CATARACT EXTRACTION W/ INTRAOCULAR LENS  IMPLANT, BILATERAL  2013   TONSILLECTOMY     TOTAL HIP ARTHROPLASTY     right, surgery April 5,2011 @ [facility]   TOTAL HIP ARTHROPLASTY  10/2009   left    Current Outpatient  Medications  Medication Instructions   apixaban  (ELIQUIS ) 5 mg, Oral, 2 times daily   atorvastatin  (LIPITOR) 10 mg, Oral, Daily at bedtime   chlorthalidone  (HYGROTON ) 12.5 mg, Daily   GLUCOSAMINE HCL PO 1 tablet, Daily   metoprolol  succinate (TOPROL  XL) 25 mg, Oral, Daily   Multiple Vitamins-Minerals (ZINC PO) 1 tablet, Daily   pantoprazole  (PROTONIX ) 20 mg, Oral, Daily before breakfast   potassium chloride  (KLOR-CON ) 10 MEQ tablet 10 mEq, Oral, Daily       Objective:   Physical Exam BP 134/80   Pulse (!) 108   Temp 97.8 F (36.6 C) (Oral)   Resp 16   Ht 6' (1.829 m)   Wt 164 lb 6 oz (74.6 kg)   SpO2 97%   BMI 22.29 kg/m  General:   Well developed, NAD, BMI noted. HEENT:  Normocephalic . Face symmetric, atraumatic Lungs:  CTA B Normal respiratory effort, no intercostal retractions, no accessory muscle use. Heart: Irregularly irregular.  Heart rate upon arrival 111.  I checked manually, heart rate in the 60s. Lower extremities: no pretibial edema bilaterally  Skin: Not pale. Not jaundice Neurologic:  alert & oriented X3.  Speech normal, gait assisted by a cane. Psych--  Cognition and judgment appear intact.  Cooperative with normal attention span and concentration.  Behavior  appropriate. No anxious or depressed appearing.      Assessment      Assessment HTN Hyperlipidemia A-fib: Dx 03/30/2023 DJD: See surgeries Neck fracture 2012, Dr. Joshua, conservative treatment H/o Kidney stones 2012 Barrett's esophagus H/o anemia: cscope , EGD done 06-2014 : colon polyps, erosive esophagitis and gastritis,+ Barrett's--- f/u prn, no further surveillance scope,  rx PPIs low-dose/long-term  PLAN: Atrial fibrillation, decreased EF, LBBB: LOV cardiology 08/16/2023.  Patient was reluctant to get a cardioversion, they decided rate control.  Initiated anticoagulation and a small amount of metoprolol .  EF was  45 to 50% likely due to the presence of LBBB per cardiology comments. At  this point he is tolerating both medications well.  Heart rate initially elevated, rechecked manually: 60s. Plan: No change HTN: On chlorthalidone , metoprolol , potassium, check BMP.  Recommend to check BPs at home. High cholesterol: On atorvastatin , last LFTs normal, check FLP. Preventive care: Declined need to have a flu shot today, recommend to pursue this fall along with COVID booster. RTC 4 months

## 2023-10-29 NOTE — Patient Instructions (Signed)
 Recommend flu shot and a COVID booster this fall  Check the  blood pressure regularly Blood pressure goal:  between 110/65 and  135/85. If it is consistently higher or lower, let me know     GO TO THE LAB :  Get the blood work   Your results will be posted on MyChart with my comments  Go to the front desk for the checkout Please make an appointment for a checkup in 4 months

## 2023-10-30 LAB — BASIC METABOLIC PANEL WITH GFR
BUN: 13 mg/dL (ref 6–23)
CO2: 32 meq/L (ref 19–32)
Calcium: 10.2 mg/dL (ref 8.4–10.5)
Chloride: 91 meq/L — ABNORMAL LOW (ref 96–112)
Creatinine, Ser: 0.82 mg/dL (ref 0.40–1.50)
GFR: 78.43 mL/min (ref >60.00)
Glucose, Bld: 103 mg/dL — ABNORMAL HIGH (ref 70–99)
Potassium: 3.8 meq/L (ref 3.5–5.1)
Sodium: 132 meq/L — ABNORMAL LOW (ref 135–145)

## 2023-10-30 LAB — LIPID PANEL
Cholesterol: 135 mg/dL (ref 0–200)
HDL: 69.4 mg/dL (ref >39.00)
LDL Cholesterol: 54 mg/dL (ref 0–99)
NonHDL: 65.13
Total CHOL/HDL Ratio: 2
Triglycerides: 57 mg/dL (ref 0.0–149.0)
VLDL: 11.4 mg/dL (ref 0.0–40.0)

## 2023-10-30 NOTE — Assessment & Plan Note (Signed)
 Atrial fibrillation, decreased EF, LBBB: LOV cardiology 08/16/2023.  Patient was reluctant to get a cardioversion, they decided rate control.  Initiated anticoagulation and a small amount of metoprolol .  EF was  45 to 50% likely due to the presence of LBBB per cardiology comments. At this point he is tolerating both medications well.  Heart rate initially elevated, rechecked manually: 60s. Plan: No change HTN: On chlorthalidone , metoprolol , potassium, check BMP.  Recommend to check BPs at home. High cholesterol: On atorvastatin , last LFTs normal, check FLP. Preventive care: Declined need to have a flu shot today, recommend to pursue this fall along with COVID booster. RTC 4 months

## 2023-10-31 ENCOUNTER — Ambulatory Visit: Payer: Self-pay | Admitting: Internal Medicine

## 2023-11-04 ENCOUNTER — Other Ambulatory Visit: Payer: Self-pay | Admitting: Internal Medicine

## 2023-12-11 ENCOUNTER — Other Ambulatory Visit: Payer: Self-pay | Admitting: Cardiology

## 2023-12-11 NOTE — Telephone Encounter (Signed)
 Prescription refill request for Eliquis  received. Indication:afib Last office visit:7/25 Scr:0.82  9/25 Age: 89 Weight:74.6  kg  Prescription refilled

## 2024-01-17 DIAGNOSIS — Z23 Encounter for immunization: Secondary | ICD-10-CM | POA: Diagnosis not present

## 2024-02-09 ENCOUNTER — Other Ambulatory Visit: Payer: Self-pay | Admitting: Internal Medicine

## 2024-02-19 ENCOUNTER — Encounter: Payer: Self-pay | Admitting: Internal Medicine

## 2024-02-19 ENCOUNTER — Ambulatory Visit: Admitting: Internal Medicine

## 2024-02-19 ENCOUNTER — Encounter: Admitting: *Deleted

## 2024-02-19 VITALS — BP 120/78 | HR 56 | Temp 97.0°F | Resp 16 | Ht 72.0 in | Wt 166.8 lb

## 2024-02-19 DIAGNOSIS — I10 Essential (primary) hypertension: Secondary | ICD-10-CM

## 2024-02-19 DIAGNOSIS — I4819 Other persistent atrial fibrillation: Secondary | ICD-10-CM

## 2024-02-19 DIAGNOSIS — I4891 Unspecified atrial fibrillation: Secondary | ICD-10-CM

## 2024-02-19 NOTE — Progress Notes (Signed)
 Pt came in and declined AWV. Feels these visits are more stressful on him and states he or his family will contact us  if they notice any needs on his behalf.  This encounter was created in error - please disregard.

## 2024-02-19 NOTE — Progress Notes (Unsigned)
 "   Subjective:    Patient ID: Chase Hernandez, male    DOB: 06/10/1933, 89 y.o.   MRN: [id number]  DOS:  02/19/2024 Follow-up  Routine follow-up. Feeling well. He remains active. Denies chest pain or difficulty breathing.  No palpitation. No blood in the stools or in the urine.    Review of Systems See above   Past Medical History:  Diagnosis Date   Anemia    Annual physical exam 02/20/2011   Atrial fibrillation (HCC) 04/01/2023   Barrett esophagus 06/25/2016   BPH (benign prostatic hyperplasia) 07/30/2008   Qualifier: Diagnosis of   By: Joshua CMA, Chemira         Colon polyps 08/13/2012   tubular adenomas   Degenerative arthritis of hip 06/13/2011   Overview:   This patient had a  right total hip arthroplasty on May 12, 2009 and left total hip arthroplasty on October 19, 2009.     He is here today for his one year follow-up of his left hip, with repeat radiographs for routine monitoring     Dyslipidemia 11/13/2006   Qualifier: Diagnosis of   By: Amon MD, Aloysius BRAVO.        Essential hypertension 11/13/2006   Qualifier: Diagnosis of   By: Amon MD, Aloysius BRAVO.        Hx of adenomatous colonic polyps 06/18/2014   Hyperlipidemia    Hypertension    Iron  (Fe) deficiency anemia 06/18/2014   Kidney stones 02/06/2010   h/o   Left bundle branch block (LBBB) 10/29/2018   Neck fracture (HCC) 04/07/2010   Dr Joshua, conservative treatnment   Osteoarthritis    severe hip-back pain, saw neurology, nerve conduction study 2/11 negative; pain was due to hip arthritis    Past Surgical History:  Procedure Laterality Date   CATARACT EXTRACTION W/ INTRAOCULAR LENS  IMPLANT, BILATERAL  2013   TONSILLECTOMY     TOTAL HIP ARTHROPLASTY     right, surgery April 5,2011 @ [facility]   TOTAL HIP ARTHROPLASTY  10/2009   left    Current Outpatient Medications  Medication Instructions   atorvastatin  (LIPITOR) 10 mg, Oral, Daily at bedtime   chlorthalidone  (HYGROTON ) 12.5 mg, Oral, Daily    Eliquis  5 mg, Oral, 2 times daily   GLUCOSAMINE HCL PO 1 tablet, Daily   metoprolol  succinate (TOPROL  XL) 25 mg, Oral, Daily   Multiple Vitamins-Minerals (ZINC PO) 1 tablet, Daily   pantoprazole  (PROTONIX ) 20 mg, Oral, Daily before breakfast   potassium chloride  (KLOR-CON ) 10 MEQ tablet 10 mEq, Oral, Daily       Objective:   Physical Exam BP 120/78 (BP Location: Left Arm, Patient Position: Sitting, Cuff Size: Normal)   Pulse (!) 103   Temp (!) 97 F (36.1 C) (Oral)   Resp 16   Ht 6' (1.829 m)   Wt 166 lb 12 oz (75.6 kg)   BMI 22.62 kg/m  General:   Well developed, NAD, BMI noted. HEENT:  Normocephalic . Face symmetric, atraumatic Lungs:  CTA B Normal respiratory effort, no intercostal retractions, no accessory muscle use. Heart: Irregularly irregular.  Heart rate rechecked manually: 56. Lower extremities: no pretibial edema bilaterally  Skin: Not pale. Not jaundice Neurologic:  alert & oriented X3.  Speech normal, gait assisted by a cane Psych--  Cognition and judgment appear intact.  Cooperative with normal attention span and concentration.  Behavior appropriate. No anxious or depressed appearing.      Assessment    Problem list HTN  Hyperlipidemia A-fib: Dx 03/30/2023 DJD: See surgeries Neck fracture 2012, Dr. Joshua, conservative treatment H/o Kidney stones 2012 Barrett's esophagus H/o anemia: cscope , EGD done 06-2014 : colon polyps, erosive esophagitis and gastritis,+ Barrett's--- f/u prn, no further surveillance scope,  rx PPIs low-dose/long-term  PLAN: Atrial fibrillation, decreased EF, LBBB: Seems to be stable on Eliquis , metoprolol . Again has decided against a cardioversion. Heart rate initially elevated -- recheck manually: 56.  At home he reports heart rates in the 60s.  O2 sat when checked at home 97%.  Plan: No change. HTN: BP looks good.  Continue chlorthalidone , metoprolol  and potassium. Increase MCV, see last CBC, consider check a B12 and folic  acid at the next opportunity Preventive care up-to-date on vaccines except for COVID booster, encouraged to proceed at the pharmacy RTC 4 months     "

## 2024-02-19 NOTE — Patient Instructions (Addendum)
 Chase Hernandez

## 2024-02-19 NOTE — Patient Instructions (Signed)
 Please read your instructions carefully.     Go to the front desk for the checkout Please make an appointment for a checkup in 4 months   Vaccines I recommend: COVID booster at the pharmacy.   Continue checking your blood pressure regularly Blood pressure goal:  between 110/65 and  130/80. If it is consistently higher or lower, let me know  Also check your heart rate: If it is consistently more than 90 let us  know

## 2024-02-20 ENCOUNTER — Other Ambulatory Visit: Payer: Self-pay | Admitting: Internal Medicine

## 2024-02-20 NOTE — Assessment & Plan Note (Signed)
 Atrial fibrillation, decreased EF, LBBB: Seems to be stable on Eliquis , metoprolol . Again has decided against a cardioversion. Heart rate initially elevated -- recheck manually: 56.  At home he reports heart rates in the 60s.  O2 sat when checked at home 97%.  Plan: No change. HTN: BP looks good.  Continue chlorthalidone , metoprolol  and potassium. Increase MCV, see last CBC, consider check a B12 and folic acid at the next opportunity Preventive care up-to-date on vaccines except for COVID booster, encouraged to proceed at the pharmacy RTC 4 months

## 2024-04-22 ENCOUNTER — Ambulatory Visit: Admitting: Cardiology

## 2024-06-20 ENCOUNTER — Ambulatory Visit: Admitting: Internal Medicine
